# Patient Record
Sex: Female | Born: 1975
Health system: Southern US, Community
[De-identification: ages and names within clinical notes are randomized; demographics above are authoritative.]

## PROBLEM LIST (undated history)

## (undated) DIAGNOSIS — N809 Endometriosis, unspecified: Secondary | ICD-10-CM

## (undated) DIAGNOSIS — J45909 Unspecified asthma, uncomplicated: Secondary | ICD-10-CM

## (undated) HISTORY — PX: OTHER SURGICAL HISTORY: SHX169

## (undated) HISTORY — PX: SHOULDER SURGERY: SHX246

## (undated) HISTORY — DX: Unspecified asthma, uncomplicated: J45.909

## (undated) HISTORY — DX: Endometriosis, unspecified: N80.9

---

## 2003-01-09 ENCOUNTER — Ambulatory Visit (HOSPITAL_BASED_OUTPATIENT_CLINIC_OR_DEPARTMENT_OTHER): Admission: RE | Admit: 2003-01-09 | Discharge: 2003-01-09 | Payer: Self-pay | Admitting: Orthopedic Surgery

## 2007-07-03 ENCOUNTER — Emergency Department (HOSPITAL_COMMUNITY): Admission: EM | Admit: 2007-07-03 | Discharge: 2007-07-03 | Payer: Self-pay | Admitting: Emergency Medicine

## 2007-07-13 ENCOUNTER — Encounter: Admission: RE | Admit: 2007-07-13 | Discharge: 2007-07-13 | Payer: Self-pay | Admitting: Orthopedic Surgery

## 2008-01-25 ENCOUNTER — Encounter: Payer: Self-pay | Admitting: Pulmonary Disease

## 2008-02-06 ENCOUNTER — Ambulatory Visit: Payer: Self-pay | Admitting: Pulmonary Disease

## 2008-02-06 DIAGNOSIS — J45909 Unspecified asthma, uncomplicated: Secondary | ICD-10-CM | POA: Insufficient documentation

## 2008-02-06 DIAGNOSIS — J309 Allergic rhinitis, unspecified: Secondary | ICD-10-CM | POA: Insufficient documentation

## 2008-02-21 ENCOUNTER — Ambulatory Visit: Payer: Self-pay | Admitting: Pulmonary Disease

## 2008-02-21 DIAGNOSIS — J45990 Exercise induced bronchospasm: Secondary | ICD-10-CM | POA: Insufficient documentation

## 2008-06-09 ENCOUNTER — Ambulatory Visit: Payer: Self-pay | Admitting: Internal Medicine

## 2008-06-25 ENCOUNTER — Ambulatory Visit: Payer: Self-pay | Admitting: Pulmonary Disease

## 2008-06-27 ENCOUNTER — Encounter: Payer: Self-pay | Admitting: Pulmonary Disease

## 2008-06-27 LAB — CONVERTED CEMR LAB
ALT: 19 units/L (ref 0–35)
AST: 19 units/L (ref 0–37)
Albumin: 3.8 g/dL (ref 3.5–5.2)
Alkaline Phosphatase: 23 units/L — ABNORMAL LOW (ref 39–117)
BUN: 9 mg/dL (ref 6–23)
Basophils Absolute: 0 10*3/uL (ref 0.0–0.1)
Basophils Relative: 0.2 % (ref 0.0–3.0)
Bilirubin, Direct: 0.1 mg/dL (ref 0.0–0.3)
CO2: 29 meq/L (ref 19–32)
Calcium: 9.2 mg/dL (ref 8.4–10.5)
Chloride: 103 meq/L (ref 96–112)
Creatinine, Ser: 0.7 mg/dL (ref 0.4–1.2)
Eosinophils Absolute: 0.3 10*3/uL (ref 0.0–0.7)
Eosinophils Relative: 4.8 % (ref 0.0–5.0)
GFR calc Af Amer: 126 mL/min
GFR calc non Af Amer: 104 mL/min
Glucose, Bld: 104 mg/dL — ABNORMAL HIGH (ref 70–99)
HCT: 35.2 % — ABNORMAL LOW (ref 36.0–46.0)
Hemoglobin: 12.5 g/dL (ref 12.0–15.0)
Lymphocytes Relative: 33.2 % (ref 12.0–46.0)
MCHC: 35.4 g/dL (ref 30.0–36.0)
MCV: 91.9 fL (ref 78.0–100.0)
Monocytes Absolute: 0.5 10*3/uL (ref 0.1–1.0)
Monocytes Relative: 7.4 % (ref 3.0–12.0)
Neutro Abs: 3.8 10*3/uL (ref 1.4–7.7)
Neutrophils Relative %: 54.4 % (ref 43.0–77.0)
Platelets: 191 10*3/uL (ref 150–400)
Potassium: 3.9 meq/L (ref 3.5–5.1)
RBC: 3.84 M/uL — ABNORMAL LOW (ref 3.87–5.11)
RDW: 11.1 % — ABNORMAL LOW (ref 11.5–14.6)
Sodium: 138 meq/L (ref 135–145)
Total Bilirubin: 0.8 mg/dL (ref 0.3–1.2)
Total Protein: 7 g/dL (ref 6.0–8.3)
WBC: 6.9 10*3/uL (ref 4.5–10.5)

## 2008-07-01 LAB — CONVERTED CEMR LAB: IgE (Immunoglobulin E), Serum: 90.8 intl units/mL (ref 0.0–180.0)

## 2008-09-01 ENCOUNTER — Telehealth: Payer: Self-pay | Admitting: Pulmonary Disease

## 2009-02-10 ENCOUNTER — Telehealth (INDEPENDENT_AMBULATORY_CARE_PROVIDER_SITE_OTHER): Payer: Self-pay | Admitting: *Deleted

## 2009-02-20 ENCOUNTER — Encounter: Admission: RE | Admit: 2009-02-20 | Discharge: 2009-02-20 | Payer: Self-pay | Admitting: Family Medicine

## 2009-03-06 ENCOUNTER — Ambulatory Visit: Payer: Self-pay | Admitting: Pulmonary Disease

## 2009-03-10 ENCOUNTER — Telehealth (INDEPENDENT_AMBULATORY_CARE_PROVIDER_SITE_OTHER): Payer: Self-pay | Admitting: *Deleted

## 2009-03-10 ENCOUNTER — Ambulatory Visit: Payer: Self-pay | Admitting: Pulmonary Disease

## 2009-12-18 ENCOUNTER — Ambulatory Visit: Payer: Self-pay | Admitting: Pulmonary Disease

## 2010-01-29 ENCOUNTER — Ambulatory Visit: Payer: Self-pay | Admitting: Pulmonary Disease

## 2010-04-02 ENCOUNTER — Ambulatory Visit: Payer: Self-pay | Admitting: Pulmonary Disease

## 2010-04-07 ENCOUNTER — Telehealth (INDEPENDENT_AMBULATORY_CARE_PROVIDER_SITE_OTHER): Payer: Self-pay | Admitting: *Deleted

## 2010-04-09 ENCOUNTER — Encounter: Payer: Self-pay | Admitting: Pulmonary Disease

## 2010-04-23 ENCOUNTER — Ambulatory Visit: Payer: Self-pay | Admitting: Pulmonary Disease

## 2010-04-26 ENCOUNTER — Encounter: Payer: Self-pay | Admitting: Pulmonary Disease

## 2010-04-26 ENCOUNTER — Telehealth: Payer: Self-pay | Admitting: Pulmonary Disease

## 2010-04-29 ENCOUNTER — Telehealth (INDEPENDENT_AMBULATORY_CARE_PROVIDER_SITE_OTHER): Payer: Self-pay | Admitting: *Deleted

## 2010-05-04 ENCOUNTER — Telehealth (INDEPENDENT_AMBULATORY_CARE_PROVIDER_SITE_OTHER): Payer: Self-pay | Admitting: *Deleted

## 2010-05-07 ENCOUNTER — Telehealth (INDEPENDENT_AMBULATORY_CARE_PROVIDER_SITE_OTHER): Payer: Self-pay | Admitting: *Deleted

## 2010-08-18 ENCOUNTER — Telehealth: Payer: Self-pay | Admitting: Pulmonary Disease

## 2010-09-03 ENCOUNTER — Ambulatory Visit: Payer: Self-pay | Admitting: Pulmonary Disease

## 2010-12-07 NOTE — Assessment & Plan Note (Signed)
Summary: rov//mbw   Copy to:  Everette Rank, Christia Reading Primary Provider/Referring Provider:  Tally Joe  CC:  Asthma.  The patient c/o increased sob with exertion and talking  and a dry cough.  History of Present Illness: 35 year old Female with known history of  asthma.  She has been getting more trouble with her breathing.  She has chest tightness, but no wheeze.  She has sinus congestion and clears her throat a lot.  She tried cutting down how often she used her symbicort, and things got much worse.  She has been using her neti pott.  She gets sinus pressure.  She has not been having fever.  Current Medications (verified): 1)  Symbicort 160-4.5 Mcg/act  Aero (Budesonide-Formoterol Fumarate) .Marland Kitchen.. 1 Puffs Two Times A Day 2)  Singulair 10 Mg  Tabs (Montelukast Sodium) .... One By Mouth At Bedtime 3)  Xopenex Hfa 45 Mcg/act  Aero (Levalbuterol Tartrate) .... 2 Puffs Qid As Needed 4)  Levora 0.15/30 (28) 0.15-30 Mg-Mcg  Tabs (Levonorgestrel-Ethinyl Estrad) .... Use As Directed 5)  Calcium 6)  Iron 7)  Garlic 8)  Multivitamins 9)  Benadryl 25 Mg Caps (Diphenhydramine Hcl) .... One By Mouth As Needed 10)  Claritin 10 Mg Tabs (Loratadine) .... One By Mouth Once Daily As Needed 11)  Ra Suphedrine Pe Sinus 5-325 Mg Tabs (Phenylephrine-Acetaminophen) .... As Directed  Allergies (verified): 1)  ! Bayer Aspirin (Aspirin) 2)  ! Celebrex 3)  ! Percocet  Past History:  Past Medical History: Reviewed history from 03/06/2009 and no changes required. Asthma      - CT chest 01/25/08 normal      - 02/06/08 PFT FEV1 3.67 (118%), FVC 4.20 (108%), TLC 6.76 (127%), DLCO 110%, +BD response      - 06/27/08 RAST  positive to dust mites      - 06/27/08 IgE 90.8 Allergic Rhinitis      -Dr. Christia Reading Insomnia Pneumonia 2007  Past Surgical History: Reviewed history from 03/06/2009 and no changes required. left shoulder arthroscopy 01/09/03 by Dr. Mckinley Jewel  Vital Signs:  Patient  profile:   35 year old female Height:      65 inches (165.10 cm) Weight:      124 pounds (56.36 kg) BMI:     20.71 O2 Sat:      100 % on Room air Temp:     98.1 degrees F (36.72 degrees C) oral Pulse rate:   74 / minute BP sitting:   96 / 62  (left arm) Cuff size:   regular  Vitals Entered By: Michel Bickers CMA (December 18, 2009 4:25 PM)  O2 Sat at Rest %:  100 O2 Flow:  Room air  Physical Exam  General:  normal appearance and thin.   Nose:   no sinus tenderness, clear nasal discharge.   Mouth:  No oral lesions Neck:  no JVD.   Lungs:   no wheezing Heart:  regular rhythm, normal rate, and no murmurs.   Abdomen:  Thin, soft, nontender Extremities:  No edema cyanosis or clubbing Cervical Nodes:  no significant adenopathy   Impression & Recommendations:  Problem # 1:  ASTHMA (ICD-493.90) Will try her on higher dose inhaled steroids with advair 500/50.  Will continue other regimen.  If she is still having difficulty will consider starting xolair.  Problem # 2:  ALLERGIC RHINITIS (ICD-477.9) She has previous episodes of epistaxis with prior nasal steroids.  Will try her on veramyst.  Advised her to  use her neti pott prior to using veramyst.  Problem # 3:  EXERCISE INDUCED BRONCHOSPASM (ICD-493.81) As above.  Problem # 4:  CHEST PAIN, ATYPICAL (ICD-786.59) I am not sure what the cause of this is.  She may be having esophageal spasm or it could be musculoskeletal.  Advised her to follow up with primary care for further assessment of this.  Medications Added to Medication List This Visit: 1)  Ra Suphedrine Pe Sinus 5-325 Mg Tabs (Phenylephrine-acetaminophen) .... As directed  Complete Medication List: 1)  Symbicort 160-4.5 Mcg/act Aero (Budesonide-formoterol fumarate) .Marland Kitchen.. 1 puffs two times a day 2)  Singulair 10 Mg Tabs (Montelukast sodium) .... One by mouth at bedtime 3)  Xopenex Hfa 45 Mcg/act Aero (Levalbuterol tartrate) .... 2 puffs qid as needed 4)  Levora 0.15/30 (28)  0.15-30 Mg-mcg Tabs (Levonorgestrel-ethinyl estrad) .... Use as directed 5)  Calcium  6)  Iron  7)  Garlic  8)  Multivitamins  9)  Benadryl 25 Mg Caps (Diphenhydramine hcl) .... One by mouth as needed 10)  Claritin 10 Mg Tabs (Loratadine) .... One by mouth once daily as needed 11)  Ra Suphedrine Pe Sinus 5-325 Mg Tabs (Phenylephrine-acetaminophen) .... As directed  Other Orders: Est. Patient Level III (18841)  Patient Instructions: 1)  Try advair one puff two times a day 2)  do not use symbicort while using advair 3)  Try using veramyst two sprays once daily after using neti pott 4)  Continue singulair 10 mg at bedtime  5)  Follow up in 6 to 8 weeks

## 2010-12-07 NOTE — Assessment & Plan Note (Signed)
Summary: rov/apc   Copy to:    Primary Provider/Referring Provider:  Tally Joe  CC:  Pt c/o tightness and SOB with dry cough x 1 week.Marland Kitchen  History of Present Illness: 35 year old Female with known history of  asthma.  She has been having chest tightness and cough which have been getting progressively worse since March.  She feels that the change in weather has brought this on.  She has been bringing up clear to yellow sputum.  She denies hemoptysis.  She has not been getting much wheeze, and denies fever.  She has been getting nasal congestion, and some sneezing.  She has a clear nasal discharge.  She denies eye irritation, and has not noticed skin rashes.  She has been using xopenex sporadically, but is not sure if this helps much.  She has been able to tolerate symbicort two puffs two times a day without jitters.  She has been using claritin and benadryl as needed.  She continues to use singulair.  She has been intolerant of nasal sprays due to nose bleeds.  Current Medications (verified): 1)  Singulair 10 Mg  Tabs (Montelukast Sodium) .... One By Mouth At Bedtime 2)  Xopenex Hfa 45 Mcg/act  Aero (Levalbuterol Tartrate) .... 2 Puffs Qid As Needed 3)  Levora 0.15/30 (28) 0.15-30 Mg-Mcg  Tabs (Levonorgestrel-Ethinyl Estrad) .... Use As Directed 4)  Calcium 5)  Iron 6)  Garlic 7)  Multivitamins 8)  Symbicort 160-4.5 Mcg/act  Aero (Budesonide-Formoterol Fumarate) .Marland Kitchen.. 1 Puffs Two Times A Day  Allergies (verified): 1)  ! Bayer Aspirin (Aspirin) 2)  ! Celebrex 3)  ! Percocet  Past History:  Past Medical History:    Asthma         - CT chest 01/25/08 normal         - 02/06/08 PFT FEV1 3.67 (118%), FVC 4.20 (108%), TLC 6.76 (127%), DLCO 110%, +BD response         - 06/27/08 RAST  positive to dust mites         - 06/27/08 IgE 90.8    Allergic Rhinitis         -Dr. Christia Reading    Insomnia    Pneumonia 2007  Past Surgical History:    left shoulder arthroscopy 01/09/03 by Dr. Mckinley Jewel  Vital Signs:  Patient profile:   35 year old female Height:      65 inches Weight:      123.25 pounds BMI:     20.58 O2 Sat:      100 % Temp:     97.9 degrees F oral Pulse rate:   71 / minute BP sitting:   108 / 70  (left arm) Cuff size:   regular  Vitals Entered By: Carron Curie CMA (March 06, 2009 4:55 PM)  O2 Sat at Rest %:  100 O2 Flow:  room air CC: Pt c/o tightness and SOB with dry cough x 1 week. Comments Medications reviewed with patient Raven Furnas CMA  March 06, 2009 4:58 PM    Physical Exam  General:  normal appearance and thin.   Nose:   no sinus tenderness, clear nasal discharge.   Mouth:  No oral lesions Neck:  no JVD.   Lungs:  decreased breath sounds, poor air entry, no wheezing Heart:  regular rhythm, normal rate, and no murmurs.   Abdomen:  Thin, soft, nontender Extremities:  No edema cyanosis or clubbing Cervical Nodes:  no significant adenopathy   Impression &  Recommendations:  Problem # 1:  ASTHMA, WITH ACUTE EXACERBATION (ICD-493.92) I will give her a taper course of prednisone.  She is to continue symbicort and singulair.  She is to also continue as needed benadryl and claritin.  I have advised her to try using mucinex for her cough and chest congestion.  She can also use salt water gargles for her throat.  If her symptoms do not improve, she may need further evaluation for either allergy shots or xolair.  Problem # 2:  ALLERGIC RHINITIS (ICD-477.9) She is to continue on singulair, claritin, and benadryl.  Unfortunately she is not able to tolerate nasal sprays.  Medications Added to Medication List This Visit: 1)  Sterapred 5 Mg Tabs (Prednisone) .... Use as directed 2)  Benadryl 25 Mg Caps (Diphenhydramine hcl) .... One by mouth as needed 3)  Claritin 10 Mg Tabs (Loratadine) .... One by mouth once daily as needed  Complete Medication List: 1)  Symbicort 160-4.5 Mcg/act Aero (Budesonide-formoterol fumarate) .Marland Kitchen.. 1 puffs two  times a day 2)  Singulair 10 Mg Tabs (Montelukast sodium) .... One by mouth at bedtime 3)  Xopenex Hfa 45 Mcg/act Aero (Levalbuterol tartrate) .... 2 puffs qid as needed 4)  Levora 0.15/30 (28) 0.15-30 Mg-mcg Tabs (Levonorgestrel-ethinyl estrad) .... Use as directed 5)  Calcium  6)  Iron  7)  Garlic  8)  Multivitamins  9)  Sterapred 5 Mg Tabs (Prednisone) .... Use as directed 10)  Benadryl 25 Mg Caps (Diphenhydramine hcl) .... One by mouth as needed 11)  Claritin 10 Mg Tabs (Loratadine) .... One by mouth once daily as needed  Other Orders: Prescription Created Electronically [phone]) Est. Patient Level III (72536)  Patient Instructions: 1)  Sterapred use as directed 2)  Call if no better by next week 3)  Follow up in 4 to 6 months Prescriptions: STERAPRED 5 MG TABS (PREDNISONE) use as directed  #1 x 0   Entered and Authorized by:   Coralyn Helling MD   Signed by:   Coralyn Helling MD on 03/06/2009   Method used:   Electronically to        [facility]  [phone]* (retail)       [address] [place], [place]  [address]       Ph: [phone] or [phone]       Fax: [phone]   RxID:   [id number]

## 2010-12-07 NOTE — Progress Notes (Signed)
Summary: symbicort usage/ sob  Phone Note Call from Patient   Caller: Patient Call For: sood Summary of Call: pt states that she has had sob x 3 weeks. has begun (as of this weekend) using symbicort 2 puffs in the am and 1 in the pm. is this ok?  Initial call taken by: Tivis Ringer,  February 10, 2009 12:18 PM  Follow-up for Phone Call        Spoke with pt.  She states that she had noticed an increase in sob for several wks, so she started taking the symbicort 2 puffs in the am and 1 at night.  She states that this helps somewhat, but still feels need to use rescue inaher at least once per day.  Has ov with you on 4-30.  Please advise recs, thanks! Follow-up by: Vernie Murders,  February 10, 2009 2:05 PM  Additional Follow-up for Phone Call Additional follow up Details #1::        Have her use symbicort two puffs two times a day, continue singulair 10mg  at bedtime, and continue as needed xopenex.   Additional Follow-up by: Coralyn Helling MD,  February 10, 2009 7:20 PM    Additional Follow-up for Phone Call Additional follow up Details #2::    pt aware of dr sood's response, also told pt if she needed [place] before 4/30 to pls call back. Follow-up by: Philipp Deputy CMA,  February 11, 2009 10:02 AM

## 2010-12-07 NOTE — Progress Notes (Signed)
Summary: refill for singulair  Phone Note Call from Patient Call back at Home Phone [phone]   Caller: Patient Call For: Tena Linebaugh Reason for Call: Refill Medication Summary of Call: Pt needs refill on singulair 10mg , has ov scheduled for 10/28.[facility] Initial call taken by: Darletta Moll,  August 18, 2010 8:12 AM  Follow-up for Phone Call        spoke with pt singulair refill sent. pt aware. Pansey Pinheiro CMA  August 18, 2010 9:36 AM     Prescriptions: SINGULAIR 10 MG  TABS (MONTELUKAST SODIUM) one by mouth at bedtime  #30 Tablet x 0   Entered by:   Carron Curie CMA   Authorized by:   Coralyn Helling MD   Signed by:   Carron Curie CMA on 08/18/2010   Method used:   Electronically to        [facility]  [phone]* (retail)       [address] [place], [place]  [address]       Ph: [phone] or [phone]       Fax: [phone]   RxID:   [id number]

## 2010-12-07 NOTE — Progress Notes (Signed)
Summary: form needed/ re: DULERA-AWAITING FAX  Phone Note Call from Patient   Caller: Patient Call For: sood Summary of Call: pt was told by ins. co that dr sood's nurse needs to call re: getting form for non formulary drug DULERA- auth. call [phone]. ID # (if needed) is [id number]. pt ph# [phone] Initial call taken by: Tivis Ringer, CNA,  April 07, 2010 9:53 AM  Follow-up for Phone Call        Called the number that pt requested.  Was tranferred to several different prompts and then was told that I was calling the wrong number and transferred to another prompt and waited approx 10 minutes.  Will try again later. Vernie Murders  April 07, 2010 2:10 PM  Called the number listed again and was given the correct number which is [phone].  Awaiting form to be faxed to triage faxline. Follow-up by: Vernie Murders,  April 07, 2010 3:21 PM  Additional Follow-up for Phone Call Additional follow up Details #1::        Form placed in VS lookat to be completed. Vernie Murders  April 07, 2010 3:41 PM     Additional Follow-up for Phone Call Additional follow up Details #2::    I have completed form. Follow-up by: Coralyn Helling MD,  April 08, 2010 9:13 AM   Appended Document: form needed/ re: DULERA-AWAITING FAX form faxed and placed in triage to await approval or denial letter.   Appended Document: form needed/ re: DULERA-AWAITING FAX Approval letter for Elwin Sleight has been scanned into EMR.  Pt aware dulera is approved.  She verbalized understanding.

## 2010-12-07 NOTE — Assessment & Plan Note (Signed)
Summary: sob/cb   Visit Type:  Initial Consult Referred by:  Everette Rank, Christia Reading PCP:  Tally Joe  Chief Complaint:  Pulmonary Consult .  History of Present Illness: I met Ms. Rocha today for evaluaton of her difficulty breathing.    Her problems started [age] weeks age.  She says that she was driving at the time.  She then developed a pain in her chest on the left side.  This then moved to the center of her chest.  This pain lasted for hours.  Since then she has been having trouble with her breathing, and gets winded when walking.  Previously she was able to maintain a regular exercise program.  She has a dry cough.  She also has sinus problems with a runny nose.  She does have a history of allergy and asthma.  She says that she outgrew these problems when she was a child.  She used to get lots of sinus and ear infections as a child.  She has not been having much wheezing, and denies hemoptysis.  She has not had any fever, sweats, weight loss, or skin rashes.  She used to have problems with eczema.  She does c/o occasional reflux symptoms.  She does get problems with her breathing with exercise even before this current illness.  She has an albuterol inhaler which she feels only helps slightly.  She had started using asmanex, but stopped this after several days because she didn't notice any difference.  She has been started on prednisone and feels this has helped some.  She has been seen by Dr. Eldridge Dace.  She reports having a normal echo, and is scheduled for stress test next week.  She has been seen by Dr. Jenne Pane for her sinuses.    CT chest  01/25/08 was negative.  Spirometry  02/05/08 showed FEV1/FVC 99%, FEV1 2.61 (79%), FVC 2.63 (68%).  PFT's were done in my office today.  Spirometry showed post-bronchodilator FEV1/FVC 87%, FEV1 3.67 (118%), FVC 4.20 (108%), and bronchodilator response suggested by change in FEF 25-75.  Lung volumes showed TLC 6.76 (127%), and RV 2.52 (152%).   Diffusion capacity was 110%.  These would be consisted with no airflow obstruction, suggestion of bronchodilator responsiveness, and airtrapping.  Of note is that she has been on asmanex and prednisone recently.     Current Allergies: ! BAYER ASPIRIN (ASPIRIN) ! CELEBREX ! PERCOCET  Past Medical History:    Asthma    Allergic Rhinitis    Insomnia    Pneumonia 2007  Past Surgical History:    left shoulder surgery   Family History:    Mother - allergies, dissected carotid artery, HTN, Squamous Cell Carcinoma of skin    Father - CAD    Aunt, Uncle - Kidney failure    Aunt - Asthma  Social History:    Single, no history of smoking or significant alcohol use.  Works as Adult nurse.   Risk Factors:  Tobacco use:  never   Review of Systems  General      Complains of fatigue and sleep disorder.      Denies fever, chills, sweats, and weight loss.  Eyes      Denies blurring and double vision.  ENT      Complains of nasal congestion, nosebleeds, and post nasal drip.  CV      Complains of racing/skipping heart beats and shortness of breath with exertion.  Resp      Complains of cough and shortness  of breath.      Denies excessive sputum, coughing up blood, and wheezing.  GI      Complains of heartburn/acid reflux.   Vital Signs:  Patient Profile:   35 Years Old Female Height:     65 inches Weight:      124 pounds O2 Sat:      100 % O2 treatment:    Room Air Temp:     98.0 degrees F oral Pulse rate:   82 / minute BP sitting:   102 / 60  (left arm)  Vitals Entered By: Marinus Maw (February 06, 2008 2:51 PM)             Is Patient Diabetic? No Comments Medications reviewed with patient ..................................................................Marland KitchenMarinus Maw  February 06, 2008 2:54 PM      Physical Exam  General:     normal appearance and thin.   Eyes:     PERRLA and EOMI.   Nose:     clear nasal discharge, no sinus  tenderness Mouth:     No oral lesions Neck:     No LAN, no Thyromegaly Lungs:     No wheezing or rales Heart:     regular rhythm, normal rate, and no murmurs.   Abdomen:     Thin, soft, nontender, no organomegaly, + bowel sounds Extremities:     No edema cyanosis or clubbing Neurologic:     gait normal and strength normal, no focal deficits.     Impression & Recommendations:  Problem # 1:  DYSPNEA (ICD-786.05) Based on her prior history of asthma and allergies, and on the current PFT findings of bronchodilator response and airtrapping I suspect that some of her symptoms are related to asthma.  She has noticed a difference in her symptoms since being started on prednisone, and I would have her complete her tappering course of this.  I have also asked her to resume using her asmanex.  She is to continue on claritin as well.  I will start her on singulair 10 mg at bedtime.  She had a normal CT chest which should essentially exclude any lung parenchymal or pulmonary vascular problem.  She does not have any history or physical findings which would be suggestive of a connective tissue disease.  I will monitor her response to these interventions, and determine if additional measures are needed.  I have advised her that it can take some time before her symptoms get better. Orders: Full Pulmomary Function Test (PFT) Consultation Level V (16109)   Problem # 2:  CHEST PAIN (ICD-786.50) She is currently being evaluated by cardiology.  She had a negative Echo, and is scheduled to have a stress test.  If this is negative, then it is possible her chest pain could be related to reflux.  I have asked her to do a therapeutic trial of prilosec 20 mg once daily to see if this helps with her symptoms. Orders: Consultation Level V (60454)   Problem # 3:  ALLERGIC RHINITIS (ICD-477.9) She is to continue on claritin, and I will start her on singulair.  She apparently was not able to tolerated nasal  inhalers.  She is due to follow up with Dr. Jenne Pane for this.  She may also need to have allergy testing done if her symptoms persist. Her updated medication list for this problem includes:    Claritin 10 Mg Tabs (Loratadine) .Marland Kitchen... Take one tab each day.  Orders: Consultation Level V [phone])  Medications Added to Medication List This Visit: 1)  Levora 0.15/30 (28) 0.15-30 Mg-mcg Tabs (Levonorgestrel-ethinyl estrad) .... Use as directed 2)  Clonazepam 0.5 Mg Tabs (Clonazepam) .... Take one at bedtime 3)  Albuterol 90 Mcg/act Aers (Albuterol) .... Two puffs qid as needed 4)  Claritin 10 Mg Tabs (Loratadine) .... Take one tab each day. 5)  Calcium  6)  Iron  7)  Garlic  8)  Multivitamins  9)  Prednisone 20 Mg Tabs (Prednisone) .... Taper 10)  Asmanex 120 Metered Doses 220 Mcg/inh Aepb (Mometasone furoate) .... 2 puffs at bedtime 11)  Singulair 10 Mg Tabs (Montelukast sodium) .... One by mouth at bedtime 12)  Prilosec Otc 20 Mg Tbec (Omeprazole magnesium) .... One by mouth 30 minutes before breakfast   Patient Instructions: 1)  Asmanex 2 sprays at bedtime, and rinse your mouth after each use 2)  Singulair 10mg  at bedtime  3)  Albuterol 2 puffs upto four times per day as needed 4)  Prilosec 20 mg per day 30 minutes before breakfast.  You can get this over the counter 5)  Keep using Claritin 6)  Discuss with Dr. Jenne Pane about other steroid nose sprays 7)  Follow up in 2 to 3 weeks  Pt did not have Asmanex rx - given samples ..................................................................Marland KitchenMarinus Maw  February 06, 2008 5:01 PM   Prescriptions: SINGULAIR 10 MG  TABS (MONTELUKAST SODIUM) one by mouth at bedtime  #30 x 1   Entered and Authorized by:   Coralyn Helling MD   Signed by:   Coralyn Helling MD on 02/06/2008   Method used:   Print then Give to Patient   RxID:   [id number]  ]

## 2010-12-07 NOTE — Miscellaneous (Signed)
Summary: Orders Update pft charges  Clinical Lists Changes  Orders: Added new Service order of Carbon Monoxide diffusing w/capacity (94720) - Signed Added new Service order of Lung Volumes (94240) - Signed Added new Service order of Spirometry (Pre & Post) (94060) - Signed 

## 2010-12-07 NOTE — Medication Information (Signed)
Summary: Singulair/[facility]  Singulair/[facility]   Imported By: Sherian Rein 09/08/2010 08:47:05  _____________________________________________________________________  External Attachment:    Type:   Image     Comment:   External Document

## 2010-12-07 NOTE — Progress Notes (Signed)
Summary: symbicort side effect  Phone Note Call from Patient   Caller: Patient Call For: Delfin Squillace Summary of Call: pt c/o "shakiness/ nervousness". says this is due to the rx symbicort. [facility] [address] pt at work [phone] Initial call taken by: Tivis Ringer,  September 01, 2008 12:11 PM  Follow-up for Phone Call        Breathing is doing well.  Has problem with jitters after using symbicort.  Will decrease symbicort to one puff two times a day, and monitor.  If this persists will need to adjust inhaler regimen. Follow-up by: Coralyn Helling MD,  September 01, 2008 12:39 PM    New/Updated Medications: SYMBICORT 160-4.5 MCG/ACT  AERO (BUDESONIDE-FORMOTEROL FUMARATE) 1 puffs two times a day

## 2010-12-07 NOTE — Assessment & Plan Note (Signed)
Summary: rov ///kp   Primary Provider/Referring Provider:  Tally Joe  CC:  rov and no new problems.  History of Present Illness: 35 year old Female with known history of  asthma.  She has done better with dulera.  She was a 5 out of 10 with her breathing before dulera.  She is now a 8 out of 10.  She gets trouble with humid weather.  She still gets cough and chest tightness.  She does not have much sputum or wheeze.  She has not had fever, and her sinuses are okay.    Current Medications (verified): 1)  Dulera 200-5 Mcg/act Aero (Mometasone Furo-Formoterol Fum) .... Two Puffs Two Times A Day 2)  Singulair 10 Mg  Tabs (Montelukast Sodium) .... One By Mouth At Bedtime 3)  Xopenex Hfa 45 Mcg/act  Aero (Levalbuterol Tartrate) .... 2 Puffs Qid As Needed 4)  Levora 0.15/30 (28) 0.15-30 Mg-Mcg  Tabs (Levonorgestrel-Ethinyl Estrad) .... Use As Directed 5)  Calcium 6)  Iron 7)  Multivitamins 8)  Benadryl 25 Mg Caps (Diphenhydramine Hcl) .... One By Mouth As Needed 9)  Claritin 10 Mg Tabs (Loratadine) .... One By Mouth Once Daily As Needed 10)  B Complex  Tabs (B Complex Vitamins) .... Once Daily 11)  Sinus Wash Neti Pot 2300-700 Mg Kit (Sodium Chloride-Sodium Bicarb) .... Once Daily  Allergies (verified): 1)  ! Bayer Aspirin (Aspirin) 2)  ! Celebrex 3)  ! Percocet  Past History:  Past Medical History: Last updated: 03/06/2009 Asthma      - CT chest 01/25/08 normal      - 02/06/08 PFT FEV1 3.67 (118%), FVC 4.20 (108%), TLC 6.76 (127%), DLCO 110%, +BD response      - 06/27/08 RAST  positive to dust mites      - 06/27/08 IgE 90.8 Allergic Rhinitis      -Dr. Christia Reading Insomnia Pneumonia 2007  Past Surgical History: Last updated: 03/06/2009 left shoulder arthroscopy 01/09/03 by Dr. Mckinley Jewel  Vital Signs:  Patient profile:   35 year old female Height:      65 inches Weight:      123 pounds O2 Sat:      98 % on Room air Temp:     98.2 degrees F oral Pulse rate:    81 / minute BP sitting:   108 / 72  (right arm) Cuff size:   regular  Vitals Entered By: Denna Haggard, CMA (Apr 02, 2010 4:39 PM)  O2 Sat at Rest %:  98% O2 Flow:  Room air  Physical Exam  General:  normal appearance and thin.   Nose:   no sinus tenderness, clear nasal discharge.   Mouth:  No oral lesions Neck:  no JVD.   Lungs:   no wheezing Heart:  regular rhythm, normal rate, and no murmurs.   Extremities:  No edema cyanosis or clubbing Cervical Nodes:  no significant adenopathy   Impression & Recommendations:  Problem # 1:  ASTHMA (ICD-493.90) She has persistent asthma symptoms partially controlled with high dose ICS, LABA, and singulair.  She has a prior positive RAST and elevated IgE.  I think she would be a good candidate for xolair.  She is reluctant to do xolair at this time due to concern about how this may affect her work schedule.  I will repeat her pulmonary function test, and then discuss further whether we should push for xolair.  She has not been controlled on prior use of advair or symbicort.  She has improved control on dulera.  I have asked her to find out from her insurance company what the appeal process is for non-formulary medications.  Problem # 2:  ALLERGIC RHINITIS (ICD-477.9)  She is to continue [employer], singulair, and as needed anti-histamine.  She is not able to tolerate nasal sprays.  She may also need imaging studies of her sinuses.  Problem # 3:  EXERCISE INDUCED BRONCHOSPASM (ICD-493.81) Again advised to try using xopenex prior to exercising.  Complete Medication List: 1)  Dulera 200-5 Mcg/act Aero (Mometasone furo-formoterol fum) .... Two puffs two times a day 2)  Singulair 10 Mg Tabs (Montelukast sodium) .... One by mouth at bedtime 3)  Xopenex Hfa 45 Mcg/act Aero (Levalbuterol tartrate) .... 2 puffs qid as needed 4)  Levora 0.15/30 (28) 0.15-30 Mg-mcg Tabs (Levonorgestrel-ethinyl estrad) .... Use as directed 5)  Calcium  6)  Iron    7)  Multivitamins  8)  Benadryl 25 Mg Caps (Diphenhydramine hcl) .... One by mouth as needed 9)  Claritin 10 Mg Tabs (Loratadine) .... One by mouth once daily as needed 10)  B Complex Tabs (B complex vitamins) .... Once daily 11)  Sinus Wash Neti Pot 2300-700 Mg Kit (Sodium chloride-sodium bicarb) .... Once daily  Other Orders: Est. Patient Level III (21308) Full Pulmonary Function Test (PFT)  Patient Instructions: 1)  Will schedule breathing test (PFT) and call with the results 2)  Follow up in 4 months  Prevention & Chronic Care Immunizations   Influenza vaccine: Fluvax 3+  (09/07/2009)    Tetanus booster: Not documented    Pneumococcal vaccine: Not documented  Other Screening   Pap smear: Not documented   Smoking status: never  (02/06/2008)    Immunization History:  Influenza Immunization History:    Influenza:  fluvax 3+ (09/07/2009)

## 2010-12-07 NOTE — Assessment & Plan Note (Signed)
Summary: extreme sob/apc   Referred by:  Everette Rank, Christia Reading PCP:  Tally Joe  Chief Complaint:  asthma, diff breathing, chest tightness, and dry cough no wheezing x3-4 weeks.  History of Present Illness: 35 year old Female with known history of  asthma and atypical chest pain.  Recent stress test with Dr. Eldridge Dace, per the patient this was normal for her cardiac status,.  She had an episode last week in which she passed out.  She was standing outside while her mother was cutting her hair.  She suddenly felt light headed, and the fell to the floor.  She did not actually lose consciousness.  Her father brought her inside, and had her lay down.  He checked her blood pressure and found that it was low.  She has not had recurrence of this since then.  Seen by Dr. Jenne Pane, ENT-- cauderized one of her vessel for her epistaxis.  He gave her a sample of astepro, and recommended that she refrain from using nasal steroids for the time being.  06/09/08--Returns for persistent dry cough, tightness, decreased activity tolerance, no energy. Feels Asmanex is not working. Denies chest pain, orthopnea, hemoptysis, fever, n/v/d, edema.      Updated Prior Medication List: SINGULAIR 10 MG  TABS (MONTELUKAST SODIUM) one by mouth at bedtime ALBUTEROL 90 MCG/ACT  AERS (ALBUTEROL) Two puffs qid as needed LEVORA 0.15/30 (28) 0.15-30 MG-MCG  TABS (LEVONORGESTREL-ETHINYL ESTRAD) Use as directed CLONAZEPAM 0.5 MG  TABS (CLONAZEPAM) Take one at bedtime * CALCIUM  * IRON  * GARLIC  * MULTIVITAMINS  ASMANEX 120 METERED DOSES 220 MCG/INH  AEPB (MOMETASONE FUROATE) 2 puffs two times a day  Current Allergies (reviewed today): ! BAYER ASPIRIN (ASPIRIN) ! CELEBREX ! PERCOCET   Family History:    Reviewed history from 02/06/2008 and no changes required:       Mother - allergies, dissected carotid artery, HTN, Squamous Cell Carcinoma of skin       Father - CAD       Aunt, Uncle - Kidney failure       Aunt -  Asthma, heart disease       mother alive age 44  hx of stroke, borderline HBP       father alive age 3  hx of prostate problems       1 brother alive age 90  hx of joint pain       1 brother alive age 40  hx of hernia repair  Social History:    Reviewed history from 02/06/2008 and no changes required:       Single, no history of smoking or significant alcohol use.  Works as Adult nurse.  no children   Risk Factors: Tobacco use:  never   Review of Systems      See HPI   Vital Signs:  Patient Profile:   35 Years Old Female Height:     65 inches Weight:      119.25 pounds O2 Sat:      100 % O2 treatment:    Room Air Temp:     97.8 degrees F oral Pulse rate:   96 / minute BP sitting:   110 / 74  (left arm) Cuff size:   regular  Vitals Entered By: Boone Master CNA (June 09, 2008 4:44 PM)             Is Patient Diabetic? No Comments Medications reviewed with patient Boone Master CNA  June 09, 2008 4:47 PM      Physical Exam  GENERAL:  A/Ox3; pleasant & cooperative.NAD HEENT:  [place]/AT, EOM-wnl, PERRLA, EACs-clear, TMs-wnl, NOSE-clear, THROAT-clear & wnl. NECK:  Supple w/ fair ROM; no JVD; normal carotid impulses w/o bruits; no thyromegaly or nodules palpated; no lymphadenopathy. CHEST:  Clear to P & A; w/o, wheezes/ rales/ or rhonchi. HEART:  RRR, no m/r/g  heard ABDOMEN:  Soft & nt; nml bowel sounds; no organomegaly or masses detected. EXT: Warm bilat,  no calf pain, edema, clubbing, pulses intact Skin: no rash/lesion       Problem # 1:  ASTHMA (ICD-493.90) Multifactoral symptoms. Unclear if all related to asthma. Xopenex neb given   REC: Stop Asmanex Begin Symbicort 80/4.86mcg 2 puffs two times a day , rinse/gargle after each use follow up 2 weeks Dr. Craige Cotta.  Please contact office for sooner follow up if symptoms do not improve or worsen  The following medications were removed from the medication list:    Asmanex 120 Metered Doses 220 Mcg/inh Aepb  (Mometasone furoate) .Marland Kitchen... 2 puffs at bedtime  Her updated medication list for this problem includes:    Singulair 10 Mg Tabs (Montelukast sodium) ..... One by mouth at bedtime    Albuterol 90 Mcg/act Aers (Albuterol) .Marland Kitchen..Marland Kitchen Two puffs qid as needed    Asmanex 120 Metered Doses 220 Mcg/inh Aepb (Mometasone furoate) .Marland Kitchen... 2 puffs two times a day  Orders: Nebulizer Tx (16109) Est. Patient Level III (60454)   Medications Added to Medication List This Visit: 1)  Asmanex 120 Metered Doses 220 Mcg/inh Aepb (Mometasone furoate) .... 2 puffs two times a day  Complete Medication List: 1)  Singulair 10 Mg Tabs (Montelukast sodium) .... One by mouth at bedtime 2)  Albuterol 90 Mcg/act Aers (Albuterol) .... Two puffs qid as needed 3)  Levora 0.15/30 (28) 0.15-30 Mg-mcg Tabs (Levonorgestrel-ethinyl estrad) .... Use as directed 4)  Clonazepam 0.5 Mg Tabs (Clonazepam) .... Take one at bedtime 5)  Calcium  6)  Iron  7)  Garlic  8)  Multivitamins  9)  Asmanex 120 Metered Doses 220 Mcg/inh Aepb (Mometasone furoate) .... 2 puffs two times a day   Patient Instructions: 1)  Stop Asmanex 2)  Begin Symbicort 80/4.18mcg 2 puffs two times a day , rinse/gargle after each use 3)  follow up 2 weeks Dr. Craige Cotta.  4)  Please contact office for sooner follow up if symptoms do not improve or worsen    ]

## 2010-12-07 NOTE — Progress Notes (Signed)
Summary: talk to nurse  Phone Note Call from Patient Call back at Work Phone [phone]   Caller: Patient Call For: sood Summary of Call: Pt states she's still experiencing a lot of chest tightness, wants to know if he wants her to come in today for cxr or wait until she's done with her prednisone. Initial call taken by: Darletta Moll,  May 07, 2010 8:29 AM  Follow-up for Phone Call        Spoke with pt who started prednisone on the 28th 3rd day and feels no better chest still tight, feels no worse. Advised on 6/28 to call in few days. Since long weekend wants to know does she need cxr.  Dr Craige Cotta out of office today. Dr Sherene Sires doc of the day, Please advise .Kandice Hams CMA  May 07, 2010 9:27 AM needs ov today or go er if condition worsens  Follow-up by: Nyoka Cowden MD,  May 07, 2010 11:24 AM  Additional Follow-up for Phone Call Additional follow up Details #1::        Spoke withpt informed her of Dr Sherene Sires recommendations; offered pt appt this pm with NP, pt says she will monitor symptoms since just starting prednisone Kandice Hams [facility]  May 07, 2010 12:05 PM  Additional Follow-up by: Kandice Hams CMA,  May 07, 2010 12:05 PM

## 2010-12-07 NOTE — Assessment & Plan Note (Signed)
Summary: fu 2 wks////kp   Referred by:  Everette Rank, Christia Reading PCP:  Tally Joe  Chief Complaint:  cough somewhat better - fainted over the weekend - BP 87/74 per father.  History of Present Illness: I saw Ms. Messinger in follow up for her asthma and atypical chest pain.  Since her last visit she had a stress test with Dr. Eldridge Dace.  Per the patient this was normal for her cardiac status, although she did notice trouble with her breathing.  She was seen by Dr. Jenne Pane.  He cauderized one of her vessel for her epistaxis.  He gave her a sample of astepro, and recommended that she refrain from using nasal steroids for the time being.  She had an episode last week in which she passed out.  She was standing outside while her mother was cutting her hair.  She suddenly felt light headed, and the fell to the floor.  She did not actually lose consciousness.  Her father brought her inside, and had her lay down.  He checked her blood pressure and found that it was low.  She has not had recurrence of this since then.  With regard to her breathing she feels improved.  She rated her symptoms as 25 out of 100 at her first visit with me.  She now rates it as 80 out of 100.  She has been able to start exercising again.  She still has some difficulty with exercise, but this seems improved compared to before.  She try using prevacid, but only for a few days.  She continues to have chest discomfort.     Current Allergies: ! BAYER ASPIRIN (ASPIRIN) ! CELEBREX ! PERCOCET      Vital Signs:  Patient Profile:   35 Years Old Female Height:     65 inches Weight:      124.13 pounds O2 Sat:      97 % O2 treatment:    Room Air Temp:     98.0 degrees F oral Pulse rate:   78 / minute BP sitting:   106 / 60  (left arm)  Vitals Entered By: Marinus Maw (February 21, 2008 4:17 PM)             Comments Medications reviewed with  patient ..................................................................Marland KitchenMarinus Maw  February 21, 2008 4:23 PM      Physical Exam  Nose:     clear nasal discharge, no sinus tenderness Mouth:     No oral lesions Neck:     No LAN, no Thyromegaly Lungs:     No wheezing or rales Heart:     regular rhythm, normal rate, and no murmurs.   Abdomen:     Thin, soft, nontender Extremities:     No edema cyanosis or clubbing     Impression & Recommendations:  Problem # 1:  ASTHMA (ICD-493.90) She has improved quite a bit.  I will continue her on asmanex and singulair.  She is to use her albuterol as needed.  If her symptoms still persist, then we may need to add a LABA. The following medications were removed from the medication list:    Prednisone 20 Mg Tabs (Prednisone) .Marland Kitchen... Taper  Her updated medication list for this problem includes:    Asmanex 120 Metered Doses 220 Mcg/inh Aepb (Mometasone furoate) .Marland Kitchen... 2 puffs at bedtime    Singulair 10 Mg Tabs (Montelukast sodium) ..... One by mouth at bedtime    Albuterol 90 Mcg/act Aers (Albuterol) .Marland Kitchen..Marland Kitchen Two puffs  qid as needed  Orders: Est. Patient Level II (41324)   Problem # 2:  ALLERGIC RHINITIS (ICD-477.9) She is to continue on singulair, claritin and astepro.  No nasal steroids at this time due to nose bleeds. Her updated medication list for this problem includes:    Claritin 10 Mg Tabs (Loratadine) .Marland Kitchen... Take one tab each day.  Orders: Est. Patient Level II (40102)   Problem # 3:  CHEST PAIN (ICD-786.50) I again think this may be GI related.  I have advised her to stay on a PPI for at least 3 to 4 weeks to determine if this helps. Orders: Est. Patient Level II (72536)   Problem # 4:  EXERCISE INDUCED BRONCHOSPASM (ICD-493.81) I have discussed various techniques she can do prior to exercising to minimize her symptoms. The following medications were removed from the medication list:    Prednisone 20 Mg Tabs  (Prednisone) .Marland Kitchen... Taper  Her updated medication list for this problem includes:    Asmanex 120 Metered Doses 220 Mcg/inh Aepb (Mometasone furoate) .Marland Kitchen... 2 puffs at bedtime    Singulair 10 Mg Tabs (Montelukast sodium) ..... One by mouth at bedtime    Albuterol 90 Mcg/act Aers (Albuterol) .Marland Kitchen..Marland Kitchen Two puffs qid as needed  Orders: Est. Patient Level II (64403)   Problem # 5:  INSOMNIA UNSPECIFIED (ICD-780.52) Will address this at her next follow up Orders: Est. Patient Level II (47425)   Problem # 6:  VASOVAGAL SYNCOPE (ICD-780.2) I am not sure what the inciting event was for this.  She did have an essentially normal cardiac evaluation just recently.  Her blood pressure and heart rate are stable today.  I will defer further assessment of this for her primary physician, but I don't think this is related to her pulmonary status. Orders: Est. Patient Level II (95638)    Patient Instructions: 1)  Continue using asmanex and singulair. 2)  Use prilosec on a regular basis for the next 3 to 4 weeks 3)  Check your peak flow periodically 4)  Follow up in 3 months    Prescriptions: ASMANEX 120 METERED DOSES 220 MCG/INH  AEPB (MOMETASONE FUROATE) 2 puffs at bedtime  #1 x 3   Entered and Authorized by:   Coralyn Helling MD   Signed by:   Coralyn Helling MD on 02/21/2008   Method used:   Print then Give to Patient   RxID:   [id number]  ]

## 2010-12-07 NOTE — Progress Notes (Signed)
Summary: stopped Spiriva  Phone Note Call from Patient Call back at Work Phone [phone]   Caller: Patient Call For: sood Reason for Call: Talk to Nurse Summary of Call: pt had to stop taking Spiriva.  She said it made things worse.  Do you want to prescribe something else? [facility] Initial call taken by: Eugene Gavia,  May 04, 2010 1:30 PM  Follow-up for Phone Call        called and spoke with pt.  pt states she stopped Spiriva as directed per last phone note on 04-29-2010.  Pt states her breathing and coughing is almost back to baseline.  pt does still c/o tightness in chest.  pt wanted to know what VS recs are regarding a different inhaler to replace the spiriva.  please advise.  also, pt requests to have a sample first before getting an rx just in case she has difficulty with it like she did with the spiriva.  will forward message to VS to address. Aundra Millet Reynolds LPN  May 04, 2010 1:47 PM   Additional Follow-up for Phone Call Additional follow up Details #1::        She was not able to tolerate spiriva (developed cough and increased dyspnea after two doses).  She still has cough, chest tightness, and chest discomfort with cough.  Not much sputum and denies fever.  Will send script for prednisone taper.  Advised her to call if her symptoms do not improve.  If so, she will need further chest imaging studies. Additional Follow-up by: Coralyn Helling MD,  May 04, 2010 5:55 PM    New/Updated Medications: PREDNISONE 10 MG TABS (PREDNISONE) 3 pills for 2 days, 2 pills for 2 days, 1 pill for 2 days, then stop prednisone Prescriptions: PREDNISONE 10 MG TABS (PREDNISONE) 3 pills for 2 days, 2 pills for 2 days, 1 pill for 2 days, then stop prednisone  #12 x 0   Entered and Authorized by:   Coralyn Helling MD   Signed by:   Coralyn Helling MD on 05/04/2010   Method used:   Electronically to        [facility]  [phone]* (retail)       [address] [place], [place]   [address]       Ph: [phone] or [phone]       Fax: [phone]   RxID:   [id number]

## 2010-12-07 NOTE — Progress Notes (Signed)
Summary: sob  Phone Note Call from Patient Call back at Work Phone [phone]   Caller: Patient Call For: sood Reason for Call: Talk to Nurse Summary of Call: started Spiriva this week...having more trouble breathing than normally.  Is this normal?  Should she continue? Initial call taken by: Eugene Gavia,  April 29, 2010 11:21 AM  Follow-up for Phone Call        Per phone note from June 20,2011, pt to start spiriva and call office if sxs are no better with spiriva.   Spoke with pt.  Pt states since starting spiriva breathing is worse.  Having increased SOB and "a lot of tightness in chest and coughing."  Cough is dry.  Requesting recs- ? cont spiriva.  VS out of the office until tomorrow after lunch-pt did not want to wait until then.  Will forward message to doc of the day-Dr. Maple Hudson, pls advise.  Thanks! Gweneth Dimitri RN  April 29, 2010 1:42 PM Allergies:  ASA, Celebrex, perocoet    Additional Follow-up for Phone Call Additional follow up Details #1::        If cough seems worse BECAUSE of spiriva, then stop Spiriva. Otherwise- is there fever/ sore throat etc to suggest antibiotic. What does she think she needs? Additional Follow-up by: Waymon Budge MD,  April 29, 2010 1:52 PM    Additional Follow-up for Phone Call Additional follow up Details #2::    Spoke with pt.  Pt states cough does seem worse because of the spiriva.  Informed her due to this, stop Spiriva per CY-she verbalized understanding.  Pt also states throat is not sore-just seems irriated at this time-relates this to the coughing.  Pt also states vision was blurry yesterday and having bad HAs.  Denies fever.  Will forward message back to CY-pls advise if you would like to do anything else at this time.  If not, will forward message to VS as FYI.  Thanks! Gweneth Dimitri RN  April 29, 2010 2:03 PM   Additional Follow-up for Phone Call Additional follow up Details #3:: Details for Additional Follow-up Action Taken: OK to  send this message string to Dr Craige Cotta Additional Follow-up by: Waymon Budge MD,  April 29, 2010 8:11 PM

## 2010-12-07 NOTE — Medication Information (Signed)
Summary: Approved/[employer]  Approved/[employer]   Imported By: Lester [place] 04/16/2010 07:52:04  _____________________________________________________________________  External Attachment:    Type:   Image     Comment:   External Document

## 2010-12-07 NOTE — Assessment & Plan Note (Signed)
Summary: rov 14 days////kp   Visit Type:  Follow-up Referred by:  Everette Rank, Christia Reading PCP:  Tally Joe  Chief Complaint:  2 week follow up - feels much better on Symbicort.  History of Present Illness: 35 year old Female with known history of  asthma.  She was seen by Tammy earlier this month because her asthma symptoms were getting worse again.  She was started on symbicort 80/4.5.  This has helped, but she is still getting problems with chest tightness.  This especially happens when she is exercising or around air conditioning.  She tries using her proair, but this makes her feel jittery.     Current Allergies: ! BAYER ASPIRIN (ASPIRIN) ! CELEBREX ! PERCOCET      Vital Signs:  Patient Profile:   35 Years Old Female Height:     65 inches Weight:      119.50 pounds O2 Sat:      100 % O2 treatment:    Room Air Temp:     98.0 degrees F oral Pulse rate:   75 / minute BP sitting:   102 / 62  (left arm) Cuff size:   regular  Vitals Entered ByMarinus Maw (June 25, 2008 4:37 PM)             Comments Medications reviewed with patient Marinus Maw  June 25, 2008 4:39 PM      Physical Exam  General:     normal appearance and thin.   Nose:      no sinus tenderness Mouth:     No oral lesions Neck:     No LAN, no Thyromegaly Lungs:     Faint wheeze heard at left base Heart:     regular rhythm, normal rate, and no murmurs.   Abdomen:     Thin, soft, nontender Extremities:     No edema cyanosis or clubbing      Problem # 1:  ASTHMA (ICD-493.90) She has improved with the start of symbicort, but is still having some problems.  I will increase her dose of symbicort to 160/4.5 two puffs two times a day.  I will also switch her to xopenex HFA since she was not able to tolerate proair.  She is to continue on singulair.  I have advised her to try taking benadryl at night and claritin during the day.  I will also have her undergo lab test  to check for allergies.  Medications Added to Medication List This Visit: 1)  Xopenex Hfa 45 Mcg/act Aero (Levalbuterol tartrate) .... 2 puffs qid as needed 2)  Symbicort 160-4.5 Mcg/act Aero (Budesonide-formoterol fumarate) .... 2 puffs two times a day 3)  Symbicort 80-4.5 Mcg/act Aero (Budesonide-formoterol fumarate) .... Two puffs twice daily  Complete Medication List: 1)  Singulair 10 Mg Tabs (Montelukast sodium) .... One by mouth at bedtime 2)  Xopenex Hfa 45 Mcg/act Aero (Levalbuterol tartrate) .... 2 puffs qid as needed 3)  Levora 0.15/30 (28) 0.15-30 Mg-mcg Tabs (Levonorgestrel-ethinyl estrad) .... Use as directed 4)  Clonazepam 0.5 Mg Tabs (Clonazepam) .... Take one at bedtime 5)  Calcium  6)  Iron  7)  Garlic  8)  Multivitamins  9)  Symbicort 160-4.5 Mcg/act Aero (Budesonide-formoterol fumarate) .... 2 puffs two times a day   Patient Instructions: 1)  Symbicort 160/4.5 two puffs two times a day 2)  Xopenex 2 puffs upto 4 times per day as needed 3)  Singulair 10 mg at bedtime 4)  Try benadryl  25 mg at bedtime as needed 5)  Try claritin 10 mg once daily as needed  6)  Will call with blood test results 7)  Follow up in 2 months   Prescriptions: XOPENEX HFA 45 MCG/ACT  AERO (LEVALBUTEROL TARTRATE) 2 puffs qid as needed  #1 x 2   Entered and Authorized by:   Coralyn Helling MD   Signed by:   Coralyn Helling MD on 06/25/2008   Method used:   Electronically sent to ...       [facility]  [phone]*       [address]       [place], [place]  [address]       Ph: [phone] or [phone]       Fax: [phone]   RxID:   [id number] SYMBICORT 160-4.5 MCG/ACT  AERO (BUDESONIDE-FORMOTEROL FUMARATE) 2 puffs two times a day  #1 x 2   Entered and Authorized by:   Coralyn Helling MD   Signed by:   Coralyn Helling MD on 06/25/2008   Method used:   Electronically sent to ...       [facility]  [phone]*       [address]       [place], [place]  [address]        Ph: [phone] or [phone]       Fax: [phone]   RxID:   [id number]  ]

## 2010-12-07 NOTE — Assessment & Plan Note (Signed)
Summary: rov 6 wks ///kp   Copy to:  Everette Rank, Christia Reading Primary Provider/Referring Provider:  Tally Joe  CC:  Asthma.  Allergic rhinitis.  The patient c/o sinus drainage and cough with green mucus. She is doing the sinus rinses once daily. She used  the Advair samples for 2 weeks and says she did have a hard time adjusting back to the Symbicort.Marland Kitchen  History of Present Illness: 35 year old Female with known history of  asthma.  She felt better with advair compared to symbicort, but had hoarseness while on advair.  She has been having trouble with her sinuses.  She is not on anything for this.  She has been feeling feverish for the past 2 days, and has felt sweaty.  Her throat is dry, and she has been coughing more.  She also has some chest congestion.  She feels her chest is tight, but denies wheezing.  She has been using a netti pott, and this helps.  She tried using veramyst, but this gave her a nosebleed.  She has not been using xopenex.  Current Medications (verified): 1)  Symbicort 160-4.5 Mcg/act  Aero (Budesonide-Formoterol Fumarate) .Marland Kitchen.. 1 Puffs Two Times A Day 2)  Singulair 10 Mg  Tabs (Montelukast Sodium) .... One By Mouth At Bedtime 3)  Xopenex Hfa 45 Mcg/act  Aero (Levalbuterol Tartrate) .... 2 Puffs Qid As Needed 4)  Levora 0.15/30 (28) 0.15-30 Mg-Mcg  Tabs (Levonorgestrel-Ethinyl Estrad) .... Use As Directed 5)  Calcium 6)  Iron 7)  Multivitamins 8)  Benadryl 25 Mg Caps (Diphenhydramine Hcl) .... One By Mouth As Needed 9)  Claritin 10 Mg Tabs (Loratadine) .... One By Mouth Once Daily As Needed 10)  Sudafed 12 Hour 120 Mg Xr12h-Tab (Pseudoephedrine Hcl) .... As Directed 11)  B Complex  Tabs (B Complex Vitamins) .... Once Daily 12)  Veramyst 27.5 Mcg/spray Susp (Fluticasone Furoate) .... 2 Sprays in Each Nostril Daily 13)  Sinus Wash Neti Pot 2300-700 Mg Kit (Sodium Chloride-Sodium Bicarb) .... Once Daily  Allergies (verified): 1)  ! Bayer Aspirin (Aspirin) 2)  !  Celebrex 3)  ! Percocet  Past History:  Past Medical History: Reviewed history from 03/06/2009 and no changes required. Asthma      - CT chest 01/25/08 normal      - 02/06/08 PFT FEV1 3.67 (118%), FVC 4.20 (108%), TLC 6.76 (127%), DLCO 110%, +BD response      - 06/27/08 RAST  positive to dust mites      - 06/27/08 IgE 90.8 Allergic Rhinitis      -Dr. Christia Reading Insomnia Pneumonia 2007  Past Surgical History: Reviewed history from 03/06/2009 and no changes required. left shoulder arthroscopy 01/09/03 by Dr. Mckinley Jewel  Vital Signs:  Patient profile:   35 year old female Height:      65 inches (165.10 cm) Weight:      122.50 pounds (55.68 kg) BMI:     20.46 O2 Sat:      100 % on Room air Temp:     97.7 degrees F (36.50 degrees C) oral Pulse rate:   85 / minute BP sitting:   108 / 72  (left arm) Cuff size:   regular  Vitals Entered By: Michel Bickers CMA (January 29, 2010 4:49 PM)  O2 Sat at Rest %:  100 O2 Flow:  Room air CC: Asthma.  Allergic rhinitis.  The patient c/o sinus drainage and cough with green mucus. She is doing the sinus rinses once daily. She  used  the Advair samples for 2 weeks and says she did have a hard time adjusting back to the Symbicort.   Physical Exam  General:  normal appearance and thin.   Nose:   no sinus tenderness, clear nasal discharge.   Mouth:  No oral lesions Neck:  no JVD.   Lungs:   no wheezing Heart:  regular rhythm, normal rate, and no murmurs.   Extremities:  No edema cyanosis or clubbing Cervical Nodes:  no significant adenopathy   Impression & Recommendations:  Problem # 1:  ASTHMA (ICD-493.90) I will change her to high dose dulera.  If she continues to have difficulties, may need to consider adding Xolair.  Problem # 2:  ALLERGIC RHINITIS (ICD-477.9) She is to continue EchoStar, singulair, and as needed anti-histamine.  She is not able to tolerate nasal sprays.  She may also need imaging studies of her  sinuses.  Problem # 3:  URI (ICD-465.9) She likely has a viral URI.  I don't think she needs antibiotics at this time.  Advised her to call if her symptoms get worse.  Medications Added to Medication List This Visit: 1)  Dulera 200-5 Mcg/act Aero (Mometasone furo-formoterol fum) .... Two puffs two times a day 2)  Sudafed 12 Hour 120 Mg Xr12h-tab (Pseudoephedrine hcl) .... As directed 3)  Veramyst 27.5 Mcg/spray Susp (Fluticasone furoate) .... 2 sprays in each nostril daily 4)  B Complex Tabs (B complex vitamins) .... Once daily 5)  Sinus Wash Neti Pot 2300-700 Mg Kit (Sodium chloride-sodium bicarb) .... Once daily  Complete Medication List: 1)  Dulera 200-5 Mcg/act Aero (Mometasone furo-formoterol fum) .... Two puffs two times a day 2)  Singulair 10 Mg Tabs (Montelukast sodium) .... One by mouth at bedtime 3)  Xopenex Hfa 45 Mcg/act Aero (Levalbuterol tartrate) .... 2 puffs qid as needed 4)  Levora 0.15/30 (28) 0.15-30 Mg-mcg Tabs (Levonorgestrel-ethinyl estrad) .... Use as directed 5)  Calcium  6)  Iron  7)  Multivitamins  8)  Benadryl 25 Mg Caps (Diphenhydramine hcl) .... One by mouth as needed 9)  Claritin 10 Mg Tabs (Loratadine) .... One by mouth once daily as needed 10)  Sudafed 12 Hour 120 Mg Xr12h-tab (Pseudoephedrine hcl) .... As directed 11)  B Complex Tabs (B complex vitamins) .... Once daily 12)  Sinus Wash Neti Pot 2300-700 Mg Kit (Sodium chloride-sodium bicarb) .... Once daily  Other Orders: Est. Patient Level III (02542)  Patient Instructions: 1)  Stop symbicort 2)  Dulera two puffs two times a day, and rinse mouth after each use 3)  Follow up in 4 to 6 weeks Prescriptions: DULERA 200-5 MCG/ACT AERO (MOMETASONE FURO-FORMOTEROL FUM) two puffs two times a day  #1 x 3   Entered and Authorized by:   Coralyn Helling MD   Signed by:   Coralyn Helling MD on 01/29/2010   Method used:   Electronically to        [facility]  [phone]* (retail)       [address] [place], [place]  [address]       Ph: [phone] or [phone]       Fax: [phone]   RxID:   [id number]

## 2010-12-07 NOTE — Assessment & Plan Note (Signed)
Summary: rov/jd   Visit Type:  Follow-up Primary Provider/Referring Provider:  Tally Joe  CC:  Asthma follow-up...patient c/o occasional cough with yellow mucus...no longer using Dulera...c/o sinus trouble.  History of Present Illness: 35 year old Female with known history of  asthma.  She did feel better after her last course of prednisone.  She was told by a friend about breathe easy tea.  She is drinking 3 cups per day.  Since she started doing this her breathing has improved.  She has since stopped using dulera, and has not noticed any worsening of her symptoms.  She continues to use singulair and claritin.  She also continues to use nasal irrigation.    Current Medications (verified): 1)  Singulair 10 Mg  Tabs (Montelukast Sodium) .... One By Mouth At Bedtime 2)  Xopenex Hfa 45 Mcg/act  Aero (Levalbuterol Tartrate) .... 2 Puffs Qid As Needed 3)  Levora 0.15/30 (28) 0.15-30 Mg-Mcg  Tabs (Levonorgestrel-Ethinyl Estrad) .... Use As Directed 4)  Calcium 5)  Iron 6)  Multivitamins 7)  Benadryl 25 Mg Caps (Diphenhydramine Hcl) .... One By Mouth As Needed 8)  Claritin 10 Mg Tabs (Loratadine) .... One By Mouth Once Daily As Needed 9)  B Complex  Tabs (B Complex Vitamins) .... Once Daily 10)  Sinus Wash Neti Pot 2300-700 Mg Kit (Sodium Chloride-Sodium Bicarb) .... Once Daily 11)  Breathe Easy Tea .Marland Kitchen.. 3 Cups Daily  Allergies (verified): 1)  ! Bayer Aspirin (Aspirin) 2)  ! Celebrex 3)  ! Percocet  Past History:  Past Medical History: Reviewed history from 03/06/2009 and no changes required. Asthma      - CT chest 01/25/08 normal      - 02/06/08 PFT FEV1 3.67 (118%), FVC 4.20 (108%), TLC 6.76 (127%), DLCO 110%, +BD response      - 06/27/08 RAST  positive to dust mites      - 06/27/08 IgE 90.8 Allergic Rhinitis      -Dr. Christia Reading Insomnia Pneumonia 2007  Past Surgical History: Reviewed history from 03/06/2009 and no changes required. left shoulder arthroscopy 01/09/03  by Dr. Mckinley Jewel  Review of Systems       The patient complains of nasal congestion/difficulty breathing through nose.  The patient denies shortness of breath with activity, shortness of breath at rest, productive cough, non-productive cough, coughing up blood, chest pain, indigestion, abdominal pain, difficulty swallowing, sore throat, ear ache, hand/feet swelling, rash, change in color of mucus, and fever.    Vital Signs:  Patient profile:   35 year old female Height:      65 inches (165.10 cm) Weight:      123.50 pounds (56.14 kg) BMI:     20.63 O2 Sat:      95 % on Room air Temp:     98.5 degrees F (36.94 degrees C) oral Pulse rate:   110 / minute BP sitting:   110 / 78  (left arm) Cuff size:   regular  Vitals Entered By: Michel Bickers CMA (September 03, 2010 4:29 PM)  O2 Sat at Rest %:  95 O2 Flow:  Room air CC: Asthma follow-up...patient c/o occasional cough with yellow mucus...no longer using Dulera...c/o sinus trouble Comments Medications reviewed with patient Michel Bickers [facility]  September 03, 2010 4:38 PM   Physical Exam  General:  normal appearance and thin.   Nose:   no sinus tenderness, clear nasal discharge.   Mouth:  No oral lesions Neck:  no JVD.   Lungs:  clear bilaterally to auscultation and percussion Heart:  regular rhythm, normal rate, and no murmurs.   Extremities:  No edema cyanosis or clubbing Neurologic:  normal CN II-XII and strength normal.   Cervical Nodes:  no significant adenopathy Psych:  alert and cooperative; normal mood and affect; normal attention span and concentration   Impression & Recommendations:  Problem # 1:  ASTHMA (ICD-493.90) She has improved with Breathe Easy Tea.  Will continue with singulair, claritin, and as needed xopenex.  Will monitor off of inhaled corticosteroids.  Problem # 2:  ALLERGIC RHINITIS (ICD-477.9) She is to continue singulair, claritin, and nasal irrigation.  She is not able to tolerate nasal steroid  sprays.  Problem # 3:  EXERCISE INDUCED BRONCHOSPASM (ICD-493.81) As above.  Medications Added to Medication List This Visit: 1)  Breathe Easy Tea  .Marland Kitchen.. 3 cups daily  Complete Medication List: 1)  Singulair 10 Mg Tabs (Montelukast sodium) .... One by mouth at bedtime 2)  Claritin 10 Mg Tabs (Loratadine) .... One by mouth once daily as needed 3)  Xopenex Hfa 45 Mcg/act Aero (Levalbuterol tartrate) .... 2 puffs qid as needed 4)  Breathe Easy Tea  .Marland Kitchen.. 3 cups daily 5)  Sinus Wash Neti Pot 2300-700 Mg Kit (Sodium chloride-sodium bicarb) .... Once daily 6)  Benadryl 25 Mg Caps (Diphenhydramine hcl) .... One by mouth as needed 7)  Levora 0.15/30 (28) 0.15-30 Mg-mcg Tabs (Levonorgestrel-ethinyl estrad) .... Use as directed 8)  B Complex Tabs (B complex vitamins) .... Once daily 9)  Calcium  10)  Iron  11)  Multivitamins   Other Orders: Est. Patient Level III ([address]) Admin 1st Vaccine (60454) Flu Vaccine 8yrs + (09811)  Patient Instructions: 1)  Flu shot today 2)  Follow up in 6 months Prescriptions: SINGULAIR 10 MG  TABS (MONTELUKAST SODIUM) one by mouth at bedtime  #90 x 3   Entered and Authorized by:   Coralyn Helling MD   Signed by:   Coralyn Helling MD on 09/03/2010   Method used:   Print then Mail to Patient   RxID:   [id number]                  Flu Vaccine Consent Questions     Do you have a history of severe allergic reactions to this vaccine? no    Any prior history of allergic reactions to egg and/or gelatin? no    Do you have a sensitivity to the preservative Thimersol? no    Do you have a past history of Guillan-Barre Syndrome? no    Do you currently have an acute febrile illness? no    Have you ever had a severe reaction to latex? no    Vaccine information given and explained to patient? yes    Are you currently pregnant? no    Lot Number:[id number]   Exp Date:05/07/2011   Site Given  Right Deltoid IMu1 Zackery Barefoot CMA  September 03, 2010 5:20 PM

## 2010-12-07 NOTE — Progress Notes (Signed)
Summary: still sick  Phone Note Call from Patient Call back at Work Phone [phone]   Caller: Patient Call For: SOOD Summary of Call: tightness in chest, congestion, sob, cough(really bad, brings pt to tears).  Was in 04/30, doesn't feel any better.  Please advise Initial call taken by: Eugene Gavia,  Mar 10, 2009 10:38 AM  Follow-up for Phone Call        Pt seen 4/30- still having chest tightness, cough and sob.  States that her cough is mainly dry but sometimes produces yellow sputum.  Feels that she is no better at all since seen, but states that she is no worse.  Still taking pred pack.  Please advise recs thanks! Follow-up by: Vernie Murders,  Mar 10, 2009 11:06 AM  Additional Follow-up for Phone Call Additional follow up Details #1::        She has continued cough with clear to yellow sputum, and chest congestion.  She feels like most of her congestion is on the left side.  I have asked her to come to the office for a chest xray today.  I have asked her to come to the office at 1:30 pm today for the xray.  I will also give her a course of zithromax. Additional Follow-up by: Coralyn Helling MD,  Mar 10, 2009 12:31 PM    New/Updated Medications: ZITHROMAX Z-PAK 250 MG TABS (AZITHROMYCIN) use as directed   Prescriptions: ZITHROMAX Z-PAK 250 MG TABS (AZITHROMYCIN) use as directed  #1 x 0   Entered and Authorized by:   Coralyn Helling MD   Signed by:   Coralyn Helling MD on 03/10/2009   Method used:   Electronically to        [facility]  [phone]* (retail)       [address] [place], [place]  [address]       Ph: [phone] or [phone]       Fax: [phone]   RxID:   [id number]

## 2010-12-07 NOTE — Miscellaneous (Signed)
Summary: Pulmonary function test   Pulmonary Function Test Date: 04/23/2010 Height (in.): 65 Gender: Female  Pre-Spirometry FVC    Value: 4.26 L/min   Pred: 3.86 L/min     % Pred: 111 % FEV1    Value: 3.56 L     Pred: 3.05 L     % Pred: 117 % FEV1/FVC  Value: 83 %     Pred: 78 %    FEF 25-75  Value: 3.99 L/min   Pred: 3.49 L/min     % Pred: 114 %  Post-Spirometry FVC    Value: 4.34 L/min   Pred: 3.86 L/min     % Pred: 113 % FEV1    Value: 3.65 L     Pred: 3.05 L     % Pred: 119 % FEV1/FVC  Value: 84 %     Pred: 78 %     % Pred: . % FEF 25-75  Value: 4.16 L/min   Pred: 3.49 L/min     % Pred: 119 %  Lung Volumes TLC    Value: 6.48 L   % Pred: 122 % RV    Value: 2.21 L   % Pred: 132 % DLCO    Value: 20.8 %   % Pred: 96 % DLCO/VA  Value: 4.20 %   % Pred: 95 %  Comments: Normal spirometry.  No bronchodilator response.  Increase TLC and RV suggestive of airtrapping.  Normal diffusion. Clinical Lists Changes  Observations: Added new observation of PFT COMMENTS: Normal spirometry.  No bronchodilator response.  Increase TLC and RV suggestive of airtrapping.  Normal diffusion. (04/23/2010 14:02) Added new observation of DLCO/VA%EXP: 95 % (04/23/2010 14:02) Added new observation of DLCO/VA: 4.20 % (04/23/2010 14:02) Added new observation of DLCO % EXPEC: 96 % (04/23/2010 14:02) Added new observation of DLCO: 20.8 % (04/23/2010 14:02) Added new observation of RV % EXPECT: 132 % (04/23/2010 14:02) Added new observation of RV: 2.21 L (04/23/2010 14:02) Added new observation of TLC % EXPECT: 122 % (04/23/2010 14:02) Added new observation of TLC: 6.48 L (04/23/2010 14:02) Added new observation of FEF2575%EXPS: 119 % (04/23/2010 14:02) Added new observation of PSTFEF25/75P: 3.49  (04/23/2010 14:02) Added new observation of PSTFEF25/75%: 4.16 L/min (04/23/2010 14:02) Added new observation of PSTFEV1/FCV%: . % (04/23/2010 14:02) Added new observation of FEV1FVCPRDPS: 78 % (04/23/2010  14:02) Added new observation of PSTFEV1/FVC: 84 % (04/23/2010 14:02) Added new observation of POSTFEV1%PRD: 119 % (04/23/2010 14:02) Added new observation of FEV1PRDPST: 3.05 L (04/23/2010 14:02) Added new observation of POST FEV1: 3.65 L/min (04/23/2010 14:02) Added new observation of POST FVC%EXP: 113 % (04/23/2010 14:02) Added new observation of FVCPRDPST: 3.86 L/min (04/23/2010 14:02) Added new observation of POST FVC: 4.34 L (04/23/2010 14:02) Added new observation of FEF % EXPEC: 114 % (04/23/2010 14:02) Added new observation of FEF25-75%PRE: 3.49 L/min (04/23/2010 14:02) Added new observation of FEF 25-75%: 3.99 L/min (04/23/2010 14:02) Added new observation of FEV1/FVC PRE: 78 % (04/23/2010 14:02) Added new observation of FEV1/FVC: 83 % (04/23/2010 14:02) Added new observation of FEV1 % EXP: 117 % (04/23/2010 14:02) Added new observation of FEV1 PREDICT: 3.05 L (04/23/2010 14:02) Added new observation of FEV1: 3.56 L (04/23/2010 14:02) Added new observation of FVC % EXPECT: 111 % (04/23/2010 14:02) Added new observation of FVC PREDICT: 3.86 L (04/23/2010 14:02) Added new observation of FVC: 4.26 L (04/23/2010 14:02) Added new observation of PFT HEIGHT: 65  (04/23/2010 14:02) Added new observation of PFT DATE: 04/23/2010  (04/23/2010 14:02)

## 2010-12-07 NOTE — Progress Notes (Signed)
  Phone Note Outgoing Call   Call placed by: Coralyn Helling MD,  April 26, 2010 2:12 PM Call placed to: Patient Summary of Call: PFT results d/w pt.  Still has airtrapping on PFT.  She still has chest discomfort and dyspnea.  Will start spiriva one puff once daily, and continue current dose of dulera, singulair, and as needed xopenex.  She is to call in next few weeks if symptoms are no better with spiriva.  Otherwise plan on ROV in 4 months as previously arranged.    New/Updated Medications: SPIRIVA HANDIHALER 18 MCG CAPS (TIOTROPIUM BROMIDE MONOHYDRATE) one puff once daily Prescriptions: SPIRIVA HANDIHALER 18 MCG CAPS (TIOTROPIUM BROMIDE MONOHYDRATE) one puff once daily  #30 x 6   Entered and Authorized by:   Coralyn Helling MD   Signed by:   Coralyn Helling MD on 04/26/2010   Method used:   Electronically to        [facility]  [phone]* (retail)       [address] [place], [place]  [address]       Ph: [phone] or [phone]       Fax: [phone]   RxID:   [id number]

## 2010-12-21 ENCOUNTER — Ambulatory Visit: Payer: Self-pay | Admitting: Obstetrics and Gynecology

## 2010-12-29 ENCOUNTER — Encounter (INDEPENDENT_AMBULATORY_CARE_PROVIDER_SITE_OTHER): Payer: Self-pay | Admitting: *Deleted

## 2011-01-04 NOTE — Letter (Signed)
Summary: New Patient letter  [facility]  [address] [place], [place] [address]   Phone: [phone]  Fax: [phone]       12/29/2010 MRN: [id number]  [facility] [address] [place], [place]  [address]  Dear Ms. Hansell,  Welcome to the Gastroenterology Division at [facility].    You are scheduled to see Dr.  Christella Hartigan on 01-21-11 at 8:30A.M. on the 3rd floor at [facility], [address]  We ask that you try to arrive at our office 15 minutes prior to your appointment time to allow for check-in.  We would like you to complete the enclosed self-administered evaluation form prior to your visit and bring it with you on the day of your appointment.  We will review it with you.  Also, please bring a complete list of all your medications or, if you prefer, bring the medication bottles and we will list them.  Please bring your insurance card so that we may make a copy of it.  If your insurance requires a referral to see a specialist, please bring your referral form from your primary care physician.  Co-payments are due at the time of your visit and may be paid by cash, check or credit card.     Your office visit will consist of a consult with your physician (includes a physical exam), any laboratory testing he/she may order, scheduling of any necessary diagnostic testing (e.g. x-ray, ultrasound, CT-scan), and scheduling of a procedure (e.g. Endoscopy, Colonoscopy) if required.  Please allow enough time on your schedule to allow for any/all of these possibilities.    If you cannot keep your appointment, please call [phone] to cancel or reschedule prior to your appointment date.  This allows [place] the opportunity to schedule an appointment for another patient in need of care.  If you do not cancel or reschedule by 5 p.m. the business day prior to your appointment date, you will be charged a $50.00 late cancellation/no-show fee.    Thank you for  choosing [facility] for your medical needs.  We appreciate the opportunity to care for you.  Please visit [place] at our website  to learn more about our practice.                     Sincerely,                                                             The Gastroenterology Division

## 2011-01-21 ENCOUNTER — Ambulatory Visit: Payer: Self-pay | Admitting: Gastroenterology

## 2011-02-01 ENCOUNTER — Ambulatory Visit: Payer: Self-pay | Admitting: Obstetrics and Gynecology

## 2011-02-04 ENCOUNTER — Ambulatory Visit: Payer: Self-pay | Admitting: Obstetrics and Gynecology

## 2011-02-07 LAB — PATHOLOGY REPORT

## 2011-03-25 NOTE — Op Note (Signed)
NAME:  Traci Wyatt, Traci Wyatt                  ACCOUNT NO.:  [id number]   MEDICAL RECORD NO.:  [id number]                   PATIENT TYPE:  AMB   LOCATION:  DSC                                  FACILITY:  [facility]   PHYSICIAN:  Loreta Ave, M.D.              DATE OF BIRTH:  05-02-1976   DATE OF PROCEDURE:  01/09/2003  DATE OF DISCHARGE:                                 OPERATIVE REPORT   PREOPERATIVE DIAGNOSIS:  Chronic impingement, left shoulder.   POSTOPERATIVE DIAGNOSIS:  Chronic impingement, left shoulder.  Grade III  chondromalacia AC joint.  Anterior labrum tear.   PROCEDURE:  Left shoulder examination under anesthesia, arthroscopy,  debridement of labrum and undersurface as well as superior rotator cuff.  Acromioplasty.  CA ligament release. Excision of distal clavicle.   SURGEON:  Loreta Ave, M.D.   ASSISTANT:  Arlys John D. Petrarca, P.A.-C.   ANESTHESIA:  General.   ESTIMATED BLOOD LOSS:  Minimal.   SPECIMENS:  None.  Cultures; none.   COMPLICATIONS:  None.   DRESSING:  Soft compressive with sling.   DESCRIPTION OF PROCEDURE:  The patient was brought to the operating room and  placed on the operating table in the supine position.  After adequate  anesthesia had been obtained, the shoulder was examined.  Full motion and  good stability on both sides.  Placed in a beach chair position on the  shoulder positioner.  Prepped and draped in the usual sterile fashion.  Three portals were created, standard shoulder portals, anterior, posterior,  and lateral.  Shoulder entered with a blunt obturator, distended, and  inspected.  Little fraying on the anterior labrum debrided.  Capsular  ligamentous structures, articular cartilage, biceps tendon, biceps anchor  intact.  A little fraying and partial tearing crescent region of  supraspinatus and infraspinatus tendon in the intra-articular portion of the  cuff debrided.  Did not look like a picture of the internal  impingement.  Attention was redirected subacromially.  Markedly thickened bursa and  roughening on top of the cuff.  Slight down sloping of the acromion,  although, this was not markedly hooked.  The bursa resected, cuff debrided.  The front of the acromion trimmed off and tapered superiorly with an  acromioplasty and CA ligament release.  Hemostasis with cautery.  Distal  clavicle had some grade II chondral changes contributing to impingement as  well.  Lateral cm resected.  Accuracy of decompression and  clavicle excision, cuff debridement all confirmed again from all portals.  Instruments and fluid removed.  Portals of the shoulder and bursa injected  with Marcaine.  The portal was closed with 4-0 nylon. Sterile compressive  dressing applied.  Anesthesia reversed.  Brought to the recovery room.  Tolerated the surgery well with no complications.  Loreta Ave, M.D.    DFM/MEDQ  D:  01/09/2003  T:  01/09/2003  Job:  [id number]

## 2012-05-21 ENCOUNTER — Ambulatory Visit
Admission: RE | Admit: 2012-05-21 | Discharge: 2012-05-21 | Disposition: A | Discharge status: Home / Self Care | Payer: BC Managed Care – PPO | Source: Ambulatory Visit | Attending: Family Medicine | Admitting: Family Medicine

## 2012-05-21 ENCOUNTER — Other Ambulatory Visit: Payer: Self-pay | Admitting: Family Medicine

## 2012-05-21 DIAGNOSIS — M5430 Sciatica, unspecified side: Secondary | ICD-10-CM

## 2012-07-31 ENCOUNTER — Ambulatory Visit (INDEPENDENT_AMBULATORY_CARE_PROVIDER_SITE_OTHER): Payer: BC Managed Care – PPO | Admitting: Family Medicine

## 2012-07-31 VITALS — BP 104/69 | Ht 65.0 in | Wt 118.0 lb

## 2012-07-31 DIAGNOSIS — M533 Sacrococcygeal disorders, not elsewhere classified: Secondary | ICD-10-CM | POA: Insufficient documentation

## 2012-07-31 DIAGNOSIS — M999 Biomechanical lesion, unspecified: Secondary | ICD-10-CM

## 2012-07-31 NOTE — Assessment & Plan Note (Signed)
Left-sided sacral iliac dysfunction. Patient has had this for quite some time and has done multiple different modalities. 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 Patient is also had a corticosteroid injection of the piriformis muscle which did give her some mild benefit for some time. Patient is still having significant tenderness over the left SI joint. Patient did have some osteopathic manipulation done today with some increase in flexibility and mild improvement in symptoms. Patient is going to continue the same home modalities at this time. Patient will come in 2 weeks' time. Likely a followup would like to do is do a ultrasound in this area. We'll do is see if we can look at this as well as the issue tuberosity type pain. Patient could potentially benefit from an SI joint injection under ultrasound guidance.

## 2012-07-31 NOTE — Progress Notes (Signed)
CC: left sided back pain  Subjective: Patient is a new patient here. I have seen this patient the different practice. Patient is coming in with left sacroiliac pain. 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 Patient did have an injection over piriformis muscle with minimal improvement. Patient states that it did help for a couple months but then seemed to come back. Patient states the duration of this in total has been about 9-10 months now. Patient does not remember any specific injury. Patient states that it usually starts at her left SI joint and does have some radiculopathy down the posterior aspect of her leg down to the knee. Patient denies any weakness any bowel or bladder incontinence, any fevers, chills, or abnormal weight loss. Patient is becoming very frustrated with this. Patient did have osteopathic manipulation done by me previously which did help but only short duration. Patient has had an MRI done of her lumbar spine which was unremarkable  Past medical history significant for asthma  Medications include Claritin, Benadryl, Gildess  Allergies include aspirin and anti-inflammatories patient does get hives.  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  Family history: Aunt and uncles have diabetes grandparents have cardiovascular heart disease, mother has hypertension.  Objective Filed Vitals:   07/31/12 1446  BP: 104/69   General: No apparent distress alert and oriented x3 mood and affect normal fit, healthy-appearing female Skin: Intact no erythema or signs of cellulitis Gait: Normal with good balance Back Exam: On inspection patient does not have any scoliosis or abnormalities. Patient has a negative straight leg test bilaterally. Patient is very good hamstring flexibility. Patient  does have some mild palpable tenderness over the left SI joint as well as the initial tuberosities bilaterally left greater than right. Patient has a positive Pearlean Brownie test on the left side. Patient is neurovascularly intact distally with 2+ DTRs of the patellar tendon that are symmetric. Patient is 5 out of 5 strength of the extremities Patient has known leg length discrepancy.  OMT Physical Exam  Standing structural       Occiput neutral  Shoulder left higher  Inferior angle of scapula left higher  Illiac crest left higher  Medial malleolus neutral  Standing flexion  Right Seated Flexion Left Cervical  Not examined Thoracic T4 extended rotated and side bent left Lumbar L2 flexed rotated and side bent right Sacrum Patient has a forward flexion of the sacrum Illium Right posterior ilium

## 2012-07-31 NOTE — Assessment & Plan Note (Signed)
Decision today to treat with OMT was based on Physical Exam  After verbal consent patient was treated with HVLA and ME techniques in sacral areas  Patient tolerated the procedure well with improvement in symptoms  Patient given exercises, stretches and lifestyle modifications  See medications in patient instructions if given  Patient will follow up in 2 weeks

## 2012-08-14 ENCOUNTER — Encounter: Payer: Self-pay | Admitting: Family Medicine

## 2012-08-14 ENCOUNTER — Ambulatory Visit (INDEPENDENT_AMBULATORY_CARE_PROVIDER_SITE_OTHER): Payer: BC Managed Care – PPO | Admitting: Family Medicine

## 2012-08-14 VITALS — BP 111/72 | HR 86 | Ht 65.0 in | Wt 118.0 lb

## 2012-08-14 DIAGNOSIS — M533 Sacrococcygeal disorders, not elsewhere classified: Secondary | ICD-10-CM

## 2012-08-14 DIAGNOSIS — M999 Biomechanical lesion, unspecified: Secondary | ICD-10-CM

## 2012-08-14 MED ORDER — GABAPENTIN 100 MG PO CAPS: ORAL_CAPSULE | ORAL | Status: DC

## 2012-08-14 MED ORDER — TRAMADOL HCL 50 MG PO TABS: 50.0000 mg | ORAL_TABLET | Freq: Three times a day (TID) | ORAL | Status: DC | PRN

## 2012-08-14 NOTE — Assessment & Plan Note (Signed)
Patient continues to have trouble but did have response to manipulation therapy. Patient did after verbal consent have manipulation again with moderate improvement. In addition to this patient was injected in the left SI joint. Patient tolerated the procedure well. Patient will followup in 2-3 weeks to make sure she is improving. 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 Patient given some Neurontin and tramadol prescriptions to have on hand in case she would like to refill them. Warned of potential side effects. We'll reevaluate in 2 weeks time.

## 2012-08-14 NOTE — Assessment & Plan Note (Signed)
Decision today to treat with OMT was based on Physical Exam  After verbal consent patient was treated with HVLA techniques in lumbosacral areas  Patient tolerated the procedure well with improvement in symptoms  Patient given exercises, stretches and lifestyle modifications  See medications in patient instructions if given  Patient will follow up in 2 weeks

## 2012-08-14 NOTE — Progress Notes (Signed)
CC: left sided back pain, SI dysfunction.   Subjective: Patient is coming in for follow up with left sacroiliac pain. 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 Patient did have manipulation 3 weeks ago and did have improvement for 48 hours. Since that time the pain has gradually returned.Patient denies any weakness any bowel or bladder incontinence, any fevers, chills, or abnormal weight loss.  Other modalities tried and PMHx: Patient did have an injection over piriformis muscle with minimal improvement. Patient states that it did help for a couple months but then seemed to come back. Patient states the duration of this in total has been about 9-10 months now.Patient has had an MRI done of her lumbar spine which was unremarkable.   Objective Filed Vitals:   08/14/12 1349  BP: 111/72  Pulse: 86   General: No apparent distress alert and oriented x3 mood and affect normal fit, healthy-appearing female Skin: Intact no erythema or signs of cellulitis Gait: Normal with good balance Back Exam: On inspection patient does not have any scoliosis or abnormalities. Patient has a negative straight leg test bilaterally. Patient has a positive Pearlean Brownie test on the left side. Patient is neurovascularly intact distally with 2+ DTRs of the patellar tendon that are symmetric. Patient is 5 out of 5 strength of the extremities Patient has known leg length discrepancy.  OMT Physical Exam  Standing structural       Occiput neutral  Shoulder neutral  Medial malleolus neutral  Standing flexion  left Seated Flexion Left Cervical  Not examined Thoracic T4 extended rotated and side bent left Lumbar L2 flexed rotated and side bent right Sacrum Patient has a forward flexion of the sacrum Illium right on right  Right posterior ilium   Procedure note After verbal and  written consent given pt was prepped with betadine.  1:1:.5 mixture of lidocain to marcain to kenalog 40 used in left Si joint .  Pt minimal bleeding dressed with band aid, Pt given red flags to look for pt had better pain control immediatly.

## 2012-09-04 ENCOUNTER — Ambulatory Visit (INDEPENDENT_AMBULATORY_CARE_PROVIDER_SITE_OTHER): Payer: BC Managed Care – PPO | Admitting: Family Medicine

## 2012-09-04 VITALS — BP 100/60 | Ht 65.0 in | Wt 120.0 lb

## 2012-09-04 DIAGNOSIS — M533 Sacrococcygeal disorders, not elsewhere classified: Secondary | ICD-10-CM

## 2012-09-04 NOTE — Assessment & Plan Note (Signed)
Patient continues to improve with manipulation therapy. Patient given more exercises and stretching. Patient will continue to come for manipulation therapy as long as he continues to work. Patient will come back in 4-6 weeks for further evaluation.

## 2012-09-04 NOTE — Progress Notes (Signed)
CC: left sided back pain, SI dysfunction.   Subjective: Patient is coming in for follow up with left sacroiliac pain. Patient states that she continues to improve very slowly. Patient has been doing a lot of core exercising as well as stretching and trying to do better. Patient states now the same pain that she is having is when she is sitting. Patient states that the pain is left-sided. Patient states that it's worse after working long hours. Patient though overall think this has continued to improve and does not have radiculopathy that had previously.patient did have injection of the SI joint previously and had significant improvement immediately. Patient states that has continued to improve.  Other modalities tried and PMHx: Patient did have an injection over piriformis muscle with minimal improvement. Patient states that it did help for a couple months but then seemed to come back. Patient states the duration of this in total has been about 9-10 months now.Patient has had an MRI done of her lumbar spine which was unremarkable.   Objective Filed Vitals:   09/04/12 1503  BP: 100/60   General: No apparent distress alert and oriented x3 mood and affect normal fit, healthy-appearing female Skin: Intact no erythema or signs of cellulitis Gait: Normal with good balance Back Exam: On inspection patient does not have any scoliosis or abnormalities. Patient has a negative straight leg test bilaterally. Patient has a positive Pearlean Brownie test on the left side. Patient is neurovascularly intact distally with 2+ DTRs of the patellar tendon that are symmetric. Patient is 5 out of 5 strength of the extremities Patient has known leg length discrepancy.  OMT Physical Exam  Standing structural       Occiput neutral  Shoulder neutral  Medial malleolus neutral  Standing flexion  left Seated Flexion Left Cervical  Not examined Thoracic T4 extended rotated and side bent left Lumbar L2 flexed rotated and  side bent right Sacrum Patient has a forward flexion of the sacrum Illium right on right  Right posterior ilium

## 2012-10-16 ENCOUNTER — Ambulatory Visit (INDEPENDENT_AMBULATORY_CARE_PROVIDER_SITE_OTHER): Payer: BC Managed Care – PPO | Admitting: Family Medicine

## 2012-10-16 VITALS — BP 111/79 | Ht 65.0 in | Wt 120.0 lb

## 2012-10-16 DIAGNOSIS — M999 Biomechanical lesion, unspecified: Secondary | ICD-10-CM

## 2012-10-16 NOTE — Progress Notes (Signed)
CC: left sided back pain, SI dysfunction.   Subjective: Patient is coming in for follow up with left sacroiliac pain. Patient states that she continues to improve. Patient has been doing a lot of core exercising as well as stretching and trying to do better. Patient states pain is worse with sitting. Patient states that the pain is left-sided.  Patient though overall think this has continued to improve and does not have radiculopathy that had previously.  .  Other modalities tried and PMHx: Patient did have an injection over piriformis muscle with minimal improvement. Patient states that it did help for a couple months but then seemed to come back. Patient states the duration of this in total has been about 9-10 months now.Patient has had an MRI done of her lumbar spine which was unremarkable. Patient is also had a injection of the SI joint which was helpful.   Objective Blood pressure 111/79, height 5\' 5"  (1.651 m), weight 120 lb (54.432 kg). General: No apparent distress alert and oriented x3 mood and affect normal fit, healthy-appearing female Skin: Intact no erythema or signs of cellulitis Gait: Normal with good balance Back Exam: On inspection patient does not have any scoliosis or abnormalities. Patient has a negative straight leg test bilaterally. Patient has a positive Pearlean Brownie test on the left side. Patient is neurovascularly intact distally with 2+ DTRs of the patellar tendon that are symmetric. Patient is 5 out of 5 strength of the extremities Patient has known leg length discrepancy.  OMT Physical Exam  Standing structural       Occiput neutral  Shoulder neutral  Medial malleolus neutral             Iliac crest elevated on right  Standing flexion  Right Seated Flexion Left Cervical  C4 flexed rotated and side bent left Thoracic T4 extended rotated and side bent left Lumbar Neutral Sacrum Patient has a forward flexion of the sacrum on right Ilium Right posterior  ilium

## 2012-10-16 NOTE — Assessment & Plan Note (Signed)
Continue the current therapy. 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 This would be the only area of some mild muscle imbalance that can be found today. Patient was taught home exercises today as well. We will see patient again in 6 weeks for further evaluation. Patient did respond to HVLA OMT therapy today in the lumbosacral an ileal regions.

## 2012-10-25 ENCOUNTER — Other Ambulatory Visit: Payer: Self-pay | Admitting: *Deleted

## 2012-10-25 MED ORDER — GABAPENTIN 100 MG PO CAPS: ORAL_CAPSULE | ORAL | Status: DC

## 2012-11-06 ENCOUNTER — Other Ambulatory Visit: Payer: Self-pay | Admitting: Obstetrics and Gynecology

## 2012-11-06 DIAGNOSIS — Z1231 Encounter for screening mammogram for malignant neoplasm of breast: Secondary | ICD-10-CM

## 2012-11-27 ENCOUNTER — Ambulatory Visit (INDEPENDENT_AMBULATORY_CARE_PROVIDER_SITE_OTHER): Payer: BC Managed Care – PPO | Admitting: Family Medicine

## 2012-11-27 VITALS — BP 111/71 | Ht 65.0 in | Wt 118.0 lb

## 2012-11-27 DIAGNOSIS — M999 Biomechanical lesion, unspecified: Secondary | ICD-10-CM

## 2012-11-27 NOTE — Assessment & Plan Note (Signed)
Decision today to treat with OMT was based on Physical Exam  After verbal consent patient was treated with HVLA and ME techniques in lumbar and sacrum areas  Patient tolerated the procedure well with improvement in symptoms  Patient given exercises, stretches and lifestyle modifications  See medications in patient instructions if given  Patient will follow up in 4-6 weeks

## 2012-11-27 NOTE — Progress Notes (Signed)
CC: left sided back pain, SI dysfunction.   Subjective: Patient is coming in for follow up with left sacroiliac pain. Patient states that she continues to improve. Patient did recently over 2 weeks ago had a fall onto her left hip when she was chasing her dog. Patient states that it seems to be improving slowly and would like me to be cautious with manipulation today. Patient continues to focus on core strengthening especially abdominals. Patient states pain is worse with sitting and states she's not been able to go to a movie theater for over a year secondary to this pain.. Patient states that the pain is left-sided.  Patient though overall think this has continued to improve and does not have radiculopathy for quite some time.   Other modalities tried and PMHx: Patient did have an injection over piriformis muscle with minimal improvement. Patient states that it did help for a couple months but then seemed to come back. Patient states the duration of this in total has been about 9-10 months now.Patient has had an MRI done of her lumbar spine which was unremarkable. Patient is also had a injection of the SI joint which was helpful.   Objective Blood pressure 111/71, height 5\' 5"  (1.651 m), weight 118 lb (53.524 kg). General: No apparent distress alert and oriented x3 mood and affect normal fit, healthy-appearing female Skin: Intact no erythema or signs of cellulitis Gait: Normal with good balance Back Exam: On inspection patient does not have any scoliosis or abnormalities. Patient has a negative straight leg test bilaterally. Patient has a positive Pearlean Brownie test on the left side. Patient is neurovascularly intact distally with 2+ DTRs of the patellar tendon that are symmetric. Patient is 5 out of 5 strength of the extremities Patient has known leg length discrepancy.  OMT Physical Exam  Standing structural       Occiput neutral  Shoulder neutral  Medial malleolus neutral             Iliac crest  elevated on right  Standing flexion  Right Seated Flexion Left Cervical  C4 flexed rotated and side bent left Thoracic T4 extended rotated and side bent left Lumbar Neutral Sacrum Patient has a forward flexion of the sacrum on right Ilium Right posterior ilium

## 2012-11-30 ENCOUNTER — Ambulatory Visit
Admission: RE | Admit: 2012-11-30 | Discharge: 2012-11-30 | Disposition: A | Discharge status: Home / Self Care | Payer: BC Managed Care – PPO | Source: Ambulatory Visit | Attending: Obstetrics and Gynecology | Admitting: Obstetrics and Gynecology

## 2012-11-30 DIAGNOSIS — Z1231 Encounter for screening mammogram for malignant neoplasm of breast: Secondary | ICD-10-CM

## 2013-01-08 ENCOUNTER — Ambulatory Visit (INDEPENDENT_AMBULATORY_CARE_PROVIDER_SITE_OTHER): Payer: BC Managed Care – PPO | Admitting: Family Medicine

## 2013-01-08 VITALS — BP 98/64 | Ht 65.0 in | Wt 123.0 lb

## 2013-01-08 DIAGNOSIS — M999 Biomechanical lesion, unspecified: Secondary | ICD-10-CM

## 2013-01-08 NOTE — Assessment & Plan Note (Signed)
Decision today to treat with OMT was based on Physical Exam  After verbal consent patient was treated with HVLA and ME techniques in lumbosacral areas. Patient did have a significant amount of soft tissue release done on the hip flexor today as well.  Patient tolerated the procedure well with improvement in symptoms  Patient given exercises, stretches and lifestyle modifications  See medications in patient instructions if given  Patient will follow up in 4-6 weeks   *continues to give her trouble I would consider doing injection in the ischial tuberosity. We will do an ultrasound at followup and decide if this would help.

## 2013-01-08 NOTE — Progress Notes (Signed)
CC: left sided back pain, SI dysfunction.  Subjective: Patient is coming in for follow up with left sacroiliac pain. Patient states that she continues to improve but slowly. Complaining more of left fascial pain than actual back pain now. Patient continues to focus on core strengthening especially abdominals and has been doing more of a soft tissue active release which has been helpful. Patient states pain is worse with sitting still.  Patient states that the pain is left-sided. Patient though overall think this has continued to improve and does not have radiculopathy for quite some time.    Other modalities tried and PMHx: Patient did have an injection over piriformis muscle with minimal improvement. Patient has had an MRI done of her lumbar spine which was unremarkable. Patient is also had a injection of the SI joint which was helpful.  Objective  Blood pressure 98/64, height 5\' 5"  (1.651 m), weight 123 lb (55.792 kg). General: No apparent distress alert and oriented x3 mood and affect normal fit, healthy-appearing female  Skin: Intact no erythema or signs of cellulitis  Gait: Normal with good balance  Back Exam: On inspection patient does not have any scoliosis or abnormalities. Patient has a negative straight leg test bilaterally. Patient has a positive Pearlean Brownie test on the left side. Patient is neurovascularly intact distally with 2+ DTRs of the patellar tendon that are symmetric.  Patient is 5 out of 5 strength of the extremities  Patient has known leg length discrepancy.  OMT Physical Exam  Standing structural  Occiput neutral  Shoulder neutral  Medial malleolus neutral  Iliac crest elevated on right  Standing flexion  Right  Seated Flexion  Left  Cervical  C4 flexed rotated and side bent left  Thoracic  T4 extended rotated and side bent left  Lumbar  Neutral  Sacrum  Patient has a forward flexion of the sacrum on right  Ilium  Right posterior ilium  Patient is tender to  palpation over the left ischial tuberosity.

## 2013-02-19 ENCOUNTER — Ambulatory Visit (INDEPENDENT_AMBULATORY_CARE_PROVIDER_SITE_OTHER): Payer: BC Managed Care – PPO | Admitting: Family Medicine

## 2013-02-19 VITALS — BP 112/76 | Ht 65.0 in | Wt 123.0 lb

## 2013-02-19 DIAGNOSIS — M999 Biomechanical lesion, unspecified: Secondary | ICD-10-CM

## 2013-02-19 DIAGNOSIS — M76899 Other specified enthesopathies of unspecified lower limb, excluding foot: Secondary | ICD-10-CM

## 2013-02-19 DIAGNOSIS — M7072 Other bursitis of hip, left hip: Secondary | ICD-10-CM

## 2013-02-19 DIAGNOSIS — M7070 Other bursitis of hip, unspecified hip: Secondary | ICD-10-CM | POA: Insufficient documentation

## 2013-02-19 DIAGNOSIS — M533 Sacrococcygeal disorders, not elsewhere classified: Secondary | ICD-10-CM

## 2013-02-19 MED ORDER — METHYLPREDNISOLONE ACETATE 40 MG/ML IJ SUSP: 40.0000 mg | Freq: Once | INTRAMUSCULAR | Status: DC

## 2013-02-19 MED ORDER — METHYLPREDNISOLONE ACETATE 40 MG/ML IJ SUSP
40.0000 mg | Freq: Once | INTRAMUSCULAR | Status: AC
  Administered 2013-02-19: 40 mg via INTRA_ARTICULAR

## 2013-02-19 NOTE — Assessment & Plan Note (Signed)
The patient had an injection as described above. 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 Patient will followup in 4-6 weeks for further evaluation.

## 2013-02-19 NOTE — Addendum Note (Signed)
Addended by: Jacki Cones C on: 02/19/2013 03:35 PM   Modules accepted: Orders

## 2013-02-19 NOTE — Progress Notes (Signed)
CC: left sided back pain, SI dysfunction.  Subjective: Patient is coming in for follow up with left sacroiliac pain. Patient states that she continues to improve but still a lot of pain with sitting. Complaining of pain around the . Patient continues to focus on core strengthening especially abdominals and has been doing more of a soft tissue active release which has been helpful.  Patient states that the pain is left-sided. Patient has been working on hip flexors as well. Patient though overall think this has continued to improve and does not have radiculopathy.   Other modalities tried and PMHx: Patient did have an injection over piriformis muscle with minimal improvement. Patient has had an MRI done of her lumbar spine which was unremarkable. Patient is also had a injection of the SI joint which was helpful.  Objective  Blood pressure 112/76, height 5\' 5"  (1.651 m), weight 123 lb (55.792 kg). General: No apparent distress alert and oriented x3 mood and affect normal fit, healthy-appearing female  Skin: Intact no erythema or signs of cellulitis  Gait: Normal with good balance  Back Exam: On inspection patient does not have any scoliosis or abnormalities. Patient has a negative straight leg test bilaterally. Patient has a positive Pearlean Brownie test on the left side. Patient is neurovascularly intact distally with 2+ DTRs of the patellar tendon that are symmetric.  Patient is 5 out of 5 strength of the extremities  Patient has known leg length discrepancy.  severely TTP over left ischial tuberosity.   OMT Physical Exam  Standing structural  Occiput neutral  Shoulder neutral  Medial malleolus neutral  Iliac crest elevated on right  Standing flexion  Right  Seated Flexion  Left  Cervical  C4 flexed rotated and side bent left  Thoracic  T4 extended rotated and side bent left  Lumbar  Neutral  Sacrum  Patient has a forward flexion of the sacrum on right  Ilium  Right posterior ilium  Patient  is tender to palpation over the left ischial tuberosity.  Musculoskeletal ultrasound was performed and interpreted by me today. Patient did have decent significantly large hypoechoic area on ultrasound corresponding to the ischial tuberosity bursa. This measured approximately 1.5 cm in diameter. After verbal and written consent patient was prepped with 2 alcohol swabs and under ultrasound guidance needle was placed into the bursa area and did have injection of 3 cc of 1% lidocaine and 1 cc of Kenalog 40 into the bursa. Patient tolerated the procedure very well.

## 2013-02-19 NOTE — Assessment & Plan Note (Signed)
The patient had manipulation again today. Patient tolerated the procedure very well. Encourage her to continue to do the exercises as she is doing. Followup from our manipulation in 4-6 weeks.

## 2013-02-19 NOTE — Assessment & Plan Note (Signed)
Decision today to treat with OMT was based on Physical Exam  After verbal consent patient was treated with HVLA, ME, FPR techniques in lumbar, sacral and hip areas  Patient tolerated the procedure well with improvement in symptoms  Patient given exercises, stretches and lifestyle modifications  See medications in patient instructions if given  Patient will follow up in 4-6 weeks

## 2013-03-19 ENCOUNTER — Encounter: Payer: Self-pay | Admitting: Family Medicine

## 2013-03-19 ENCOUNTER — Ambulatory Visit (INDEPENDENT_AMBULATORY_CARE_PROVIDER_SITE_OTHER): Payer: BC Managed Care – PPO | Admitting: Family Medicine

## 2013-03-19 VITALS — Ht 65.0 in | Wt 123.0 lb

## 2013-03-19 DIAGNOSIS — M999 Biomechanical lesion, unspecified: Secondary | ICD-10-CM

## 2013-03-19 NOTE — Assessment & Plan Note (Signed)
Decision today to treat with OMT was based on Physical Exam  After verbal consent patient was treated with HVLA, ME and active release techniques in lumbar and sacral areas  Patient tolerated the procedure well with improvement in symptoms  Patient given exercises, stretches and lifestyle modifications  See medications in patient instructions if given  Patient will follow up in 6-8 weeks

## 2013-03-19 NOTE — Progress Notes (Signed)
CC: left sided back pain, SI dysfunction.  Subjective: Patient is coming in for follow up with left sacroiliac pain and ischial pain.  Patient had steroid injection into the left ischial tuberosity area. Patient tolerated the procedure very well and stated that she has not felt this good in years. Patient is here to for further evaluation. Patient feels that the soft tissue surrounding the area now is somewhat tight and she does get significant improvement with active release techniques.  Patient states that the pain is left-sided. Patient is very happy with her care so far.  Other modalities tried and PMHx: Patient did have an injection over piriformis muscle with minimal improvement. Patient has had an MRI done of her lumbar spine which was unremarkable. Patient is also had a injection of the SI joint which was helpful. Injection into the left ischial tuberosity area for bursitis is very helpful as well.  Objective  Height 5\' 5"  (1.651 m), weight 123 lb (55.792 kg). General: No apparent distress alert and oriented x3 mood and affect normal fit, healthy-appearing female  Skin: Intact no erythema or signs of cellulitis  Gait: Normal with good balance  Back Exam: On inspection patient does not have any scoliosis or abnormalities. Patient has a negative straight leg test bilaterally. Patient has a NEGATIVE  Faber test on the left side. Patient is neurovascularly intact distally with 2+ DTRs of the patellar tendon that are symmetric. Tight hip flexors bilaterally.  Patient is 5 out of 5 strength of the extremities  Patient has known leg length discrepancy.   OMT Physical Exam  Standing structural  Occiput neutral  Shoulder neutral  Medial malleolus neutral  Iliac crest elevated on right  Standing flexion  Right  Seated Flexion  Left  Cervical  C4 flexed rotated and side bent left  Thoracic  T4 extended rotated and side bent left  Lumbar  Neutral  Sacrum  Right on right  Ilium  Right  posterior ilium  Patient is tender to palpation over the left ischial tuberosity.

## 2014-12-02 ENCOUNTER — Other Ambulatory Visit (INDEPENDENT_AMBULATORY_CARE_PROVIDER_SITE_OTHER): Payer: Self-pay | Admitting: Otolaryngology

## 2014-12-02 DIAGNOSIS — J0101 Acute recurrent maxillary sinusitis: Secondary | ICD-10-CM

## 2014-12-05 ENCOUNTER — Ambulatory Visit
Admission: RE | Admit: 2014-12-05 | Discharge: 2014-12-05 | Disposition: A | Discharge status: Home / Self Care | Payer: BLUE CROSS/BLUE SHIELD | Source: Ambulatory Visit | Attending: Otolaryngology | Admitting: Otolaryngology

## 2014-12-05 DIAGNOSIS — J0101 Acute recurrent maxillary sinusitis: Secondary | ICD-10-CM

## 2016-08-24 DIAGNOSIS — Z23 Encounter for immunization: Secondary | ICD-10-CM | POA: Diagnosis not present

## 2016-10-24 DIAGNOSIS — J45909 Unspecified asthma, uncomplicated: Secondary | ICD-10-CM | POA: Diagnosis not present

## 2016-12-12 DIAGNOSIS — R5383 Other fatigue: Secondary | ICD-10-CM | POA: Diagnosis not present

## 2016-12-12 DIAGNOSIS — Z6820 Body mass index (BMI) 20.0-20.9, adult: Secondary | ICD-10-CM | POA: Diagnosis not present

## 2016-12-12 DIAGNOSIS — Z01419 Encounter for gynecological examination (general) (routine) without abnormal findings: Secondary | ICD-10-CM | POA: Diagnosis not present

## 2017-04-26 DIAGNOSIS — R0789 Other chest pain: Secondary | ICD-10-CM | POA: Diagnosis not present

## 2017-08-27 DIAGNOSIS — Z23 Encounter for immunization: Secondary | ICD-10-CM | POA: Diagnosis not present

## 2017-12-18 DIAGNOSIS — Z681 Body mass index (BMI) 19 or less, adult: Secondary | ICD-10-CM | POA: Diagnosis not present

## 2017-12-18 DIAGNOSIS — Z01419 Encounter for gynecological examination (general) (routine) without abnormal findings: Secondary | ICD-10-CM | POA: Diagnosis not present

## 2018-07-25 DIAGNOSIS — B029 Zoster without complications: Secondary | ICD-10-CM | POA: Diagnosis not present

## 2018-10-27 DIAGNOSIS — Z23 Encounter for immunization: Secondary | ICD-10-CM | POA: Diagnosis not present

## 2018-12-29 DIAGNOSIS — J069 Acute upper respiratory infection, unspecified: Secondary | ICD-10-CM | POA: Diagnosis not present

## 2018-12-29 DIAGNOSIS — J209 Acute bronchitis, unspecified: Secondary | ICD-10-CM | POA: Diagnosis not present

## 2019-02-20 DIAGNOSIS — Z01419 Encounter for gynecological examination (general) (routine) without abnormal findings: Secondary | ICD-10-CM | POA: Diagnosis not present

## 2019-02-20 DIAGNOSIS — Z681 Body mass index (BMI) 19 or less, adult: Secondary | ICD-10-CM | POA: Diagnosis not present

## 2019-02-20 DIAGNOSIS — Z1151 Encounter for screening for human papillomavirus (HPV): Secondary | ICD-10-CM | POA: Diagnosis not present

## 2019-02-20 DIAGNOSIS — Z124 Encounter for screening for malignant neoplasm of cervix: Secondary | ICD-10-CM | POA: Diagnosis not present

## 2019-04-03 DIAGNOSIS — Z1231 Encounter for screening mammogram for malignant neoplasm of breast: Secondary | ICD-10-CM | POA: Diagnosis not present

## 2019-09-18 DIAGNOSIS — Z1159 Encounter for screening for other viral diseases: Secondary | ICD-10-CM | POA: Diagnosis not present

## 2019-09-23 DIAGNOSIS — K921 Melena: Secondary | ICD-10-CM | POA: Diagnosis not present

## 2019-09-23 DIAGNOSIS — D122 Benign neoplasm of ascending colon: Secondary | ICD-10-CM | POA: Diagnosis not present

## 2019-09-23 DIAGNOSIS — Z86010 Personal history of colonic polyps: Secondary | ICD-10-CM | POA: Diagnosis not present

## 2019-09-23 DIAGNOSIS — R109 Unspecified abdominal pain: Secondary | ICD-10-CM | POA: Diagnosis not present

## 2019-09-23 DIAGNOSIS — R197 Diarrhea, unspecified: Secondary | ICD-10-CM | POA: Diagnosis not present

## 2019-09-23 DIAGNOSIS — K648 Other hemorrhoids: Secondary | ICD-10-CM | POA: Diagnosis not present

## 2020-03-25 DIAGNOSIS — Z130 Encounter for screening for diseases of the blood and blood-forming organs and certain disorders involving the immune mechanism: Secondary | ICD-10-CM | POA: Diagnosis not present

## 2020-03-25 DIAGNOSIS — Z0000 Encounter for general adult medical examination without abnormal findings: Secondary | ICD-10-CM | POA: Diagnosis not present

## 2020-03-25 DIAGNOSIS — Z13220 Encounter for screening for lipoid disorders: Secondary | ICD-10-CM | POA: Diagnosis not present

## 2020-03-25 DIAGNOSIS — Z01419 Encounter for gynecological examination (general) (routine) without abnormal findings: Secondary | ICD-10-CM | POA: Diagnosis not present

## 2020-03-25 DIAGNOSIS — Z6820 Body mass index (BMI) 20.0-20.9, adult: Secondary | ICD-10-CM | POA: Diagnosis not present

## 2020-06-29 ENCOUNTER — Ambulatory Visit (INDEPENDENT_AMBULATORY_CARE_PROVIDER_SITE_OTHER): Payer: BC Managed Care – PPO

## 2020-06-29 ENCOUNTER — Ambulatory Visit (INDEPENDENT_AMBULATORY_CARE_PROVIDER_SITE_OTHER): Payer: BC Managed Care – PPO | Admitting: Family Medicine

## 2020-06-29 ENCOUNTER — Other Ambulatory Visit: Payer: Self-pay

## 2020-06-29 ENCOUNTER — Ambulatory Visit: Payer: Self-pay

## 2020-06-29 ENCOUNTER — Encounter: Payer: Self-pay | Admitting: Family Medicine

## 2020-06-29 VITALS — BP 110/72 | HR 97 | Ht 65.0 in | Wt 122.6 lb

## 2020-06-29 DIAGNOSIS — M25551 Pain in right hip: Secondary | ICD-10-CM

## 2020-06-29 DIAGNOSIS — M25552 Pain in left hip: Secondary | ICD-10-CM

## 2020-06-29 MED ORDER — NORTRIPTYLINE HCL 25 MG PO CAPS: 25.0000 mg | ORAL_CAPSULE | Freq: Every day | ORAL | 2 refills | Status: DC

## 2020-06-29 NOTE — Progress Notes (Signed)
X-ray right hip shows no significant arthritis.  Looks pretty normal to radiology.  If not better after injection would recommend MRI.

## 2020-06-29 NOTE — Progress Notes (Signed)
I, Christoper Fabian, LAT, ATC, am serving as scribe for Dr. Clementeen Graham.  Traci Wyatt is a 44 y.o. female who presents to [facility] today for B hip and low back pain.  She was last seen by Dr. Katrinka Blazing in 2014 for L-sided LBP and SIJ pain and had OMT.  Currently she notes, B hip and low back pain that has been progressively worsening x 3 weeks.  She reports chronic L hip pain and then R hip began about 6 months ago.  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  Pain is considerable and interfering with sleep.  Radiating pain: yes into B thighs LE numbness/tingling: No LE weakness: No Aggravating factors: sitting; R and L sidelying Treatments tried: massage; heat, ice, Biofreeze; stretching  Diagnostic testing: L-spine MRI- 05/21/12  Pertinent review of systems: No fevers or chills  Relevant historical information: History SI joint dysfunction and ischial bursitis left side.   Exam:  BP 110/72   Pulse 97   Ht 5\' 5"  (1.651 m)   Wt 122 lb 9.6 oz (55.6 kg)   SpO2 99%   BMI 20.40 kg/m  General: Well Developed, well nourished, and in no acute distress.   MSK: L-spine normal-appearing nontender normal motion. Right hip normal-appearing normal motion.  Mildly tender palpation at superior portion of greater trochanter. Excellent hip strength to abduction and external rotation and internal rotation and adduction.  Lateral left hip normal-appearing normal motion not particularly tender. Normal strength as above.  Leg lengths equal.    Lab and Radiology Results  X-ray images right hip obtained today personally independently reviewed Potential lucency superior to the acetabulum of the right hip.  Otherwise normal-appearing Await formal radiology review  Procedure: Real-time Ultrasound Guided Injection  of right lateral hip greater trochanter insertion of gluteus medius and greater trochanter bursa Device: Philips Affiniti 50G Images permanently stored and available for review in PACS Verbal informed consent obtained.  Discussed risks and benefits of procedure. Warned about infection bleeding damage to structures skin hypopigmentation and fat atrophy among others. Patient expresses understanding and agreement Time-out conducted.   Noted no overlying erythema, induration, or other signs of local infection.   Skin prepped in a sterile fashion.   Local anesthesia: Topical Ethyl chloride.   With sterile technique and under real time ultrasound guidance:  40 mg of Kenalog and 2 mL of Marcaine injected easily.   Completed without difficulty   Pain partially immediately resolved suggesting accurate placement of the medication.   Advised to call if fevers/chills, erythema, induration, drainage, or persistent bleeding.   Images permanently stored and available for review in the ultrasound unit.  Impression: Technically successful ultrasound guided injection.       Assessment and Plan: 44 y.o. female with right lateral hip pain.  Location of pain is typical for bursitis or tendinitis of the hip abductors.  However she has been doing an excellent job herself of physical therapy and has excellent hip strength. Plan for injection as above.  X-ray today pretty normal but I am little worried about a potential lucency in the ilium superior to the acetabulum.  Await formal radiology review may consider earlier MRI than otherwise indicated.  If not better following injection would proceed with MRI at this point anyway.  Check back as needed.  Additionally patient notes that she is  having pain at night that interfere with sleep.  Trial of nortriptyline.   PDMP not reviewed this encounter. Orders Placed This Encounter  Procedures  . [place] LIMITED JOINT SPACE STRUCTURES LOW BILAT(NO LINKED CHARGES)    Order  Specific Question:   Reason for Exam (SYMPTOM  OR DIAGNOSIS REQUIRED)    Answer:   B hip pain    Order Specific Question:   Preferred imaging location?    Answer:   [facility]  . DG HIP UNILAT WITH PELVIS 2-3 VIEWS RIGHT    Standing Status:   Future    Number of Occurrences:   1    Standing Expiration Date:   06/29/2021    Order Specific Question:   Reason for Exam (SYMPTOM  OR DIAGNOSIS REQUIRED)    Answer:   eval right hip    Order Specific Question:   Is patient pregnant?    Answer:   No    Order Specific Question:   Preferred imaging location?    Answer:   [facility]    Order Specific Question:   Radiology Contrast Protocol - do NOT remove file path    Answer:   [email or web address]   Meds ordered this encounter  Medications  . DISCONTD: nortriptyline (PAMELOR) 25 MG capsule    Sig: Take 1 capsule (25 mg total) by mouth at bedtime.    Dispense:  30 capsule    Refill:  2    For pain and sleep  . nortriptyline (PAMELOR) 25 MG capsule    Sig: Take 1 capsule (25 mg total) by mouth at bedtime.    Dispense:  30 capsule    Refill:  2    For pain and sleep     Discussed warning signs or symptoms. Please see discharge instructions. Patient expresses understanding.   The above documentation has been reviewed and is accurate and complete Clementeen Graham, M.D.

## 2020-06-29 NOTE — Patient Instructions (Addendum)
Thank you for coming in today. Get xray today on the way out.  Call or go to the ER if you develop a large red swollen joint with extreme pain or oozing puss.  Continue your PT.  If not good enough let me know and I will order MRI.  Ok to return as needed for left hip injection (if the right one works).  Recommend adding iontophoresis.    Try nortriptyline at bedtime.  Let me know if more annoying than helpful.

## 2020-07-02 ENCOUNTER — Telehealth: Payer: Self-pay | Admitting: Family Medicine

## 2020-07-02 MED ORDER — AMITRIPTYLINE HCL 25 MG PO TABS: 25.0000 mg | ORAL_TABLET | Freq: Every evening | ORAL | 1 refills | Status: DC | PRN

## 2020-07-02 NOTE — Telephone Encounter (Signed)
Returned pt's call and she verbalizes understanding about the change to amitriptyline.  She asks if this medication will also help with pain because it is the pain that is preventing her from sleeping.  Inform her that I will advise Dr. Denyse Amass and if he chooses to prescribe an additional pain medication, then he will.  [facility] on [address] is the correct pharmacy.

## 2020-07-02 NOTE — Telephone Encounter (Signed)
Pt called, the medication RX'd for sleep is not working at all. Pt requesting an alternative.

## 2020-07-02 NOTE — Telephone Encounter (Signed)
I sent in a cousin of nortriptyline called amitriptyline.  It is a little more effective for sleeping.  Try taking 1 or 2 pills at bedtime.  If that does not work already to annoying let me know I still have good alternatives.

## 2020-07-03 NOTE — Telephone Encounter (Signed)
Patient called back. I gave patient MD response.  She expressed understanding. She said that she will try the Amitriptyline this weekend an let us know how she felt over the weekend.

## 2020-07-03 NOTE — Telephone Encounter (Signed)
Called and LM for pt to return call to discuss medication options.  Please see Dr. Zollie Pee last response regarding fact that amitriptyline will also help w/ pain but wanting to know if she wants something stronger like hyrdrocodone if she can actually tolerate that medication.  Please advise pt of Dr. Zollie Pee response and gather pertinent info.

## 2020-07-03 NOTE — Telephone Encounter (Signed)
Amitriptyline should help with pain.  If not I can prescribe stronger pain medicines like opiates if needed.  Usually I would use a medicine like hydrocodone but I see that you are allergic to oxycodone.  If ibuprofen or Aleve in addition to amitriptyline is not sufficient I will be happy to prescribe 1 of these medications.  Can you tolerate hydrocodone?

## 2021-01-27 DIAGNOSIS — G4700 Insomnia, unspecified: Secondary | ICD-10-CM | POA: Diagnosis not present

## 2021-01-27 DIAGNOSIS — M25551 Pain in right hip: Secondary | ICD-10-CM | POA: Diagnosis not present

## 2021-01-27 DIAGNOSIS — R002 Palpitations: Secondary | ICD-10-CM | POA: Diagnosis not present

## 2021-02-09 ENCOUNTER — Telehealth: Payer: Self-pay

## 2021-02-09 NOTE — Telephone Encounter (Signed)
NOTES ON FILE FROM DR Clementeen Graham , REFERRAL IN PROFICIENT

## 2021-03-23 DIAGNOSIS — R002 Palpitations: Secondary | ICD-10-CM | POA: Insufficient documentation

## 2021-03-23 NOTE — Progress Notes (Signed)
Cardiology Office Note   Date:  03/24/2021   ID:  Traci Wyatt, DOB 02/12/1976, MRN [id number]  PCP:  Tally Joe, MD  Cardiologist:   None Referring:  Scifres, Peter Minium  Chief Complaint  Patient presents with  . Palpitations      History of Present Illness: Traci Wyatt is a 45 y.o. female who is referred by Scifres, Nicole Cella, PA-C for evaluation of palpitations.  The patient has no past cardiac history other than she reports an echo about 13 years ago.  She had significant asthma at that time and was being evaluated.  He was told her echocardiogram was unremarkable but I cannot pull up these results.  She had a long history of orthostatic hypotension symptoms.  I do not know if there is been formal study or diagnosis.  However, she feels like she is lightheaded when she stands up.  She has had bilateral shoulder pain and sees a sports doctor and has been unable to sleep because of this and also insomnia.  She was placed on Elavil and symptoms of tachycardia worsen.  She would notice her heart rate being elevated sometimes at baseline and very often with standing.  It might be 120 due to standing.  It ranges from the 40s to the 140s on her wearable.  She does not necessarily feel it is associated with the orthostatic symptoms or any drops in blood pressure.  She is not really checked her blood pressure when her heart rate is very elevated.  She is very physically active.  She has an exercise routine most days of the week.  She has not had any frank syncope because when she gets orthostatic she can down or avoid going completely.  She has not had any chest pressure, neck or arm discomfort.  She has not had any new shortness of breath, PND or orthopnea.  She does have fluttering in her chest which she feels when isolated skipped beat.  These are random.  It does not seem to be a trigger.  Of note she stopped the Elavil and has been switched to meds as listed below and  some of her tachycardia has improved.  Past Medical History:  Diagnosis Date  . Asthma   . Endometriosis     Past Surgical History:  Procedure Laterality Date  . Endometriosis surgery    . SHOULDER SURGERY     Left     Prior to Admission medications   Medication Sig Start Date End Date Taking? Authorizing Provider  hydrOXYzine (ATARAX/VISTARIL) 25 MG tablet Take 25-50 mg by mouth at bedtime as needed. 02/01/21  Yes [provider]  norethindrone-ethinyl estradiol (LOESTRIN) 1-20 MG-MCG tablet Take 1 tablet by mouth daily.   Yes [provider]    Allergies:   Aspirin, Celecoxib, and Oxycodone-acetaminophen    Social History:  The patient  reports that she has never smoked. She has never used smokeless tobacco.   Family History:  The patient's family history includes Chronic Renal Failure in her mother; Heart attack (age of onset: 14) in her father; Stroke in her mother.    ROS:  Please see the history of present illness.   Otherwise, review of systems are positive for none.   All other systems are reviewed and negative.    PHYSICAL EXAM: VS:  BP 118/74   Pulse 72   Ht 5\' 5"  (1.651 m)   Wt 124 lb 6.4 oz (56.4 kg)   SpO2 99%  BMI 20.70 kg/m  , BMI Body mass index is 20.7 kg/m. GENERAL:  Well appearing HEENT:  Pupils equal round and reactive, fundi not visualized, oral mucosa unremarkable NECK:  No jugular venous distention, waveform within normal limits, carotid upstroke brisk and symmetric, no bruits, no thyromegaly LYMPHATICS:  No cervical, inguinal adenopathy LUNGS:  Clear to auscultation bilaterally BACK:  No CVA tenderness CHEST:  Unremarkable HEART:  PMI not displaced or sustained,S1 and S2 within normal limits, no S3, no S4, no clicks, no rubs, no murmurs ABD:  Flat, positive bowel sounds normal in frequency in pitch, no bruits, no rebound, no guarding, no midline pulsatile mass, no hepatomegaly, no splenomegaly EXT:  2 plus pulses throughout,  no edema, no cyanosis no clubbing SKIN:  No rashes no nodules NEURO:  Cranial nerves II through XII grossly intact, motor grossly intact throughout PSYCH:  Cognitively intact, oriented to person place and time    EKG:  EKG is ordered today. The ekg ordered today demonstrates Normal sinus rhythm, rate 72, axis within normal limits, borderline low voltage in the limb in chest leads.  RSR prime V1 V2, poor anterior R wave progression, no acute ST-T wave changes.   Recent Labs: No results found for requested labs within last 8760 hours.    Lipid Panel No results found for: CHOL, TRIG, HDL, CHOLHDL, VLDL, LDLCALC, LDLDIRECT    Wt Readings from Last 3 Encounters:  03/24/21 124 lb 6.4 oz (56.4 kg)  06/29/20 122 lb 9.6 oz (55.6 kg)  03/19/13 123 lb (55.8 kg)      Other studies Reviewed: Additional studies/ records that were reviewed today include: Primary care records, labs. Review of the above records demonstrates:  Please see elsewhere in the note.     ASSESSMENT AND PLAN:  ORTHOSTATIC HYPOTENSION: She was not orthostatic.  We had a long discussion about the nonmedical therapies of this.  PALPITATIONS:   He had normal electrolytes.  TSH was normal.  She has not been anemic by her report though I do not see these labs.  I am going to apply a 2-week monitor.  I suspect she is having some premature atrial contractions and probably some sinus tachycardia.  The tachycardia far has improved and she is off of Elavil.  She is going to also get a wearable they can do an EKG.  FAMILY HISTORY OF EARLY CAD: We talked about diet and exercise and I think she is to space well in all of these.  Her lipids are excellent.  She is lower risk so does not need a coronary calcium score at this point.   Current medicines are reviewed at length with the patient today.  The patient does not have concerns regarding medicines.  The following changes have been made:  no change  Labs/ tests ordered today  include:   Orders Placed This Encounter  Procedures  . LONG TERM MONITOR (3-14 DAYS)  . EKG 12-Lead     Disposition:   FU with me as needed.      Signed, Rollene Rotunda, MD  03/24/2021 10:21 AM    [facility]

## 2021-03-24 ENCOUNTER — Ambulatory Visit (INDEPENDENT_AMBULATORY_CARE_PROVIDER_SITE_OTHER): Payer: BC Managed Care – PPO | Admitting: Cardiology

## 2021-03-24 ENCOUNTER — Encounter: Payer: Self-pay | Admitting: Cardiology

## 2021-03-24 ENCOUNTER — Ambulatory Visit (INDEPENDENT_AMBULATORY_CARE_PROVIDER_SITE_OTHER): Payer: BC Managed Care – PPO

## 2021-03-24 ENCOUNTER — Other Ambulatory Visit: Payer: Self-pay

## 2021-03-24 VITALS — BP 118/74 | HR 72 | Ht 65.0 in | Wt 124.4 lb

## 2021-03-24 DIAGNOSIS — Z049 Encounter for examination and observation for unspecified reason: Secondary | ICD-10-CM | POA: Diagnosis not present

## 2021-03-24 DIAGNOSIS — R002 Palpitations: Secondary | ICD-10-CM

## 2021-03-24 DIAGNOSIS — I951 Orthostatic hypotension: Secondary | ICD-10-CM | POA: Diagnosis not present

## 2021-03-24 DIAGNOSIS — G43019 Migraine without aura, intractable, without status migrainosus: Secondary | ICD-10-CM | POA: Diagnosis not present

## 2021-03-24 DIAGNOSIS — Z79899 Other long term (current) drug therapy: Secondary | ICD-10-CM | POA: Diagnosis not present

## 2021-03-24 NOTE — Patient Instructions (Addendum)
Medication Instructions:  Your physician recommends that you continue on your current medications as directed. Please refer to the Current Medication list given to you today.   Labwork: NONE  Testing/Procedures: 14 DAY ZIO PATCH   Follow-Up: AS NEEDED   Any Other Special Instructions Will Be Listed Below (If Applicable).  ZIO XT- Long Term Monitor Instructions   Your physician has requested you wear a ZIO patch monitor for  _14_ days.  This is a single patch monitor.   [employer] supplies one patch monitor per enrollment. Additional stickers are not available. Please do not apply patch if you will be having a Nuclear Stress Test, Echocardiogram, Cardiac CT, MRI, or Chest Xray during the period you would be wearing the monitor. The patch cannot be worn during these tests. You cannot remove and re-apply the ZIO XT patch monitor.  Your ZIO patch monitor will be sent Fed Ex from [employer] directly to your home address. It may take 3-5 days to receive your monitor after you have been enrolled.  Once you have received your monitor, please review the enclosed instructions. Your monitor has already been registered assigning a specific monitor serial # to you.  Billing and Patient Assistance Program Information   We have supplied [employer] with any of your insurance information on file for billing purposes. [employer] offers a sliding scale Patient Assistance Program for patients that do not have insurance, or whose insurance does not completely cover the cost of the ZIO monitor.   You must apply for the Patient Assistance Program to qualify for this discounted rate.     To apply, please call [employer] at [phone], select option 4, then select option 2, and ask to apply for Patient Assistance Program.  Meredeth Ide will ask your household income, and how many people are in your household.  They will quote your out-of-pocket cost based on that information.  [employer] will also be able to set up a  66-month, interest-free payment plan if needed.  Applying the monitor   Shave hair from upper left chest.  Hold abrader disc by orange tab. Rub abrader in 40 strokes over the upper left chest as indicated in your monitor instructions.  Clean area with 4 enclosed alcohol pads. Let dry.  Apply patch as indicated in monitor instructions. Patch will be placed under collarbone on left side of chest with arrow pointing upward.  Rub patch adhesive wings for 2 minutes. Remove white label marked "1". Remove the white label marked "2". Rub patch adhesive wings for 2 additional minutes.  While looking in a mirror, press and release button in center of patch. A small green light will flash 3-4 times. This will be your only indicator that the monitor has been turned on. ?  Do not shower for the first 24 hours. You may shower after the first 24 hours.  Press the button if you feel a symptom. You will hear a small click. Record Date, Time and Symptom in the Patient Logbook.  When you are ready to remove the patch, follow instructions on the last 2 pages of the Patient Logbook. Stick patch monitor onto the last page of Patient Logbook.  Place Patient Logbook in the blue and white box.  Use locking tab on box and tape box closed securely.  The blue and white box has prepaid postage on it. Please place it in the mailbox as soon as possible. Your physician should have your test results approximately 7 days after the monitor has been mailed  back to [facility].  Call [facility] Customer Care at [phone] if you have questions regarding your ZIO XT patch monitor. Call them immediately if you see an orange light blinking on your monitor.  If your monitor falls off in less than 4 days, contact our Monitor department at [phone]. ?If your monitor becomes loose or falls off after 4 days call [employer] at [phone] for suggestions on securing your monitor.?

## 2021-03-28 DIAGNOSIS — R002 Palpitations: Secondary | ICD-10-CM | POA: Diagnosis not present

## 2021-04-21 DIAGNOSIS — G43019 Migraine without aura, intractable, without status migrainosus: Secondary | ICD-10-CM | POA: Diagnosis not present

## 2021-06-09 DIAGNOSIS — Z113 Encounter for screening for infections with a predominantly sexual mode of transmission: Secondary | ICD-10-CM | POA: Diagnosis not present

## 2021-06-09 DIAGNOSIS — Z124 Encounter for screening for malignant neoplasm of cervix: Secondary | ICD-10-CM | POA: Diagnosis not present

## 2021-06-09 DIAGNOSIS — Z01419 Encounter for gynecological examination (general) (routine) without abnormal findings: Secondary | ICD-10-CM | POA: Diagnosis not present

## 2021-06-09 DIAGNOSIS — Z3041 Encounter for surveillance of contraceptive pills: Secondary | ICD-10-CM | POA: Diagnosis not present

## 2021-06-09 DIAGNOSIS — Z1231 Encounter for screening mammogram for malignant neoplasm of breast: Secondary | ICD-10-CM | POA: Diagnosis not present

## 2021-06-09 DIAGNOSIS — Z01411 Encounter for gynecological examination (general) (routine) with abnormal findings: Secondary | ICD-10-CM | POA: Diagnosis not present

## 2021-06-09 DIAGNOSIS — Z6820 Body mass index (BMI) 20.0-20.9, adult: Secondary | ICD-10-CM | POA: Diagnosis not present

## 2021-07-28 DIAGNOSIS — G43019 Migraine without aura, intractable, without status migrainosus: Secondary | ICD-10-CM | POA: Diagnosis not present

## 2021-09-07 DIAGNOSIS — Z0000 Encounter for general adult medical examination without abnormal findings: Secondary | ICD-10-CM | POA: Diagnosis not present

## 2021-09-07 DIAGNOSIS — R5383 Other fatigue: Secondary | ICD-10-CM | POA: Diagnosis not present

## 2021-09-07 DIAGNOSIS — E559 Vitamin D deficiency, unspecified: Secondary | ICD-10-CM | POA: Diagnosis not present

## 2021-09-07 DIAGNOSIS — G4700 Insomnia, unspecified: Secondary | ICD-10-CM | POA: Diagnosis not present

## 2021-09-07 DIAGNOSIS — R748 Abnormal levels of other serum enzymes: Secondary | ICD-10-CM | POA: Diagnosis not present

## 2021-09-10 NOTE — Telephone Encounter (Signed)
Please advise 

## 2021-09-24 DIAGNOSIS — H579 Unspecified disorder of eye and adnexa: Secondary | ICD-10-CM | POA: Diagnosis not present

## 2021-09-24 DIAGNOSIS — G43909 Migraine, unspecified, not intractable, without status migrainosus: Secondary | ICD-10-CM | POA: Diagnosis not present

## 2021-09-24 DIAGNOSIS — H5702 Anisocoria: Secondary | ICD-10-CM | POA: Diagnosis not present

## 2021-09-27 ENCOUNTER — Other Ambulatory Visit: Payer: Self-pay | Admitting: Family Medicine

## 2021-09-27 DIAGNOSIS — H5702 Anisocoria: Secondary | ICD-10-CM

## 2021-09-27 DIAGNOSIS — G43909 Migraine, unspecified, not intractable, without status migrainosus: Secondary | ICD-10-CM

## 2021-10-11 ENCOUNTER — Other Ambulatory Visit: Payer: BC Managed Care – PPO

## 2021-10-13 DIAGNOSIS — G4700 Insomnia, unspecified: Secondary | ICD-10-CM | POA: Diagnosis not present

## 2021-10-16 ENCOUNTER — Other Ambulatory Visit: Payer: Self-pay

## 2021-10-16 ENCOUNTER — Ambulatory Visit
Admission: RE | Admit: 2021-10-16 | Discharge: 2021-10-16 | Disposition: A | Discharge status: Home / Self Care | Payer: BC Managed Care – PPO | Source: Ambulatory Visit | Attending: Family Medicine | Admitting: Family Medicine

## 2021-10-16 DIAGNOSIS — H5702 Anisocoria: Secondary | ICD-10-CM

## 2021-10-16 DIAGNOSIS — R519 Headache, unspecified: Secondary | ICD-10-CM | POA: Diagnosis not present

## 2021-10-16 DIAGNOSIS — G43909 Migraine, unspecified, not intractable, without status migrainosus: Secondary | ICD-10-CM

## 2021-10-27 DIAGNOSIS — G43019 Migraine without aura, intractable, without status migrainosus: Secondary | ICD-10-CM | POA: Diagnosis not present

## 2021-11-24 DIAGNOSIS — R232 Flushing: Secondary | ICD-10-CM | POA: Diagnosis not present

## 2021-11-24 DIAGNOSIS — G4700 Insomnia, unspecified: Secondary | ICD-10-CM | POA: Diagnosis not present

## 2021-11-24 DIAGNOSIS — M25559 Pain in unspecified hip: Secondary | ICD-10-CM | POA: Diagnosis not present

## 2021-12-06 DIAGNOSIS — J029 Acute pharyngitis, unspecified: Secondary | ICD-10-CM | POA: Diagnosis not present

## 2021-12-06 DIAGNOSIS — U071 COVID-19: Secondary | ICD-10-CM | POA: Diagnosis not present

## 2021-12-06 DIAGNOSIS — R0981 Nasal congestion: Secondary | ICD-10-CM | POA: Diagnosis not present

## 2021-12-29 DIAGNOSIS — M25559 Pain in unspecified hip: Secondary | ICD-10-CM | POA: Diagnosis not present

## 2021-12-29 DIAGNOSIS — G4700 Insomnia, unspecified: Secondary | ICD-10-CM | POA: Diagnosis not present

## 2021-12-29 DIAGNOSIS — R232 Flushing: Secondary | ICD-10-CM | POA: Diagnosis not present

## 2022-03-02 DIAGNOSIS — R232 Flushing: Secondary | ICD-10-CM | POA: Diagnosis not present

## 2022-03-02 DIAGNOSIS — G4700 Insomnia, unspecified: Secondary | ICD-10-CM | POA: Diagnosis not present

## 2022-04-27 DIAGNOSIS — G43019 Migraine without aura, intractable, without status migrainosus: Secondary | ICD-10-CM | POA: Diagnosis not present

## 2022-05-04 DIAGNOSIS — R232 Flushing: Secondary | ICD-10-CM | POA: Diagnosis not present

## 2022-05-04 DIAGNOSIS — G4700 Insomnia, unspecified: Secondary | ICD-10-CM | POA: Diagnosis not present

## 2022-05-18 DIAGNOSIS — G479 Sleep disorder, unspecified: Secondary | ICD-10-CM | POA: Diagnosis not present

## 2022-06-01 DIAGNOSIS — G479 Sleep disorder, unspecified: Secondary | ICD-10-CM | POA: Diagnosis not present

## 2022-06-15 DIAGNOSIS — Z1231 Encounter for screening mammogram for malignant neoplasm of breast: Secondary | ICD-10-CM | POA: Diagnosis not present

## 2022-06-15 DIAGNOSIS — Z6820 Body mass index (BMI) 20.0-20.9, adult: Secondary | ICD-10-CM | POA: Diagnosis not present

## 2022-06-15 DIAGNOSIS — Z01419 Encounter for gynecological examination (general) (routine) without abnormal findings: Secondary | ICD-10-CM | POA: Diagnosis not present

## 2022-06-15 DIAGNOSIS — Z0142 Encounter for cervical smear to confirm findings of recent normal smear following initial abnormal smear: Secondary | ICD-10-CM | POA: Diagnosis not present

## 2022-09-09 DIAGNOSIS — G4710 Hypersomnia, unspecified: Secondary | ICD-10-CM | POA: Diagnosis not present

## 2022-10-11 DIAGNOSIS — G478 Other sleep disorders: Secondary | ICD-10-CM | POA: Diagnosis not present

## 2022-10-11 DIAGNOSIS — G4700 Insomnia, unspecified: Secondary | ICD-10-CM | POA: Diagnosis not present

## 2022-10-18 DIAGNOSIS — E7800 Pure hypercholesterolemia, unspecified: Secondary | ICD-10-CM | POA: Diagnosis not present

## 2022-10-18 DIAGNOSIS — E559 Vitamin D deficiency, unspecified: Secondary | ICD-10-CM | POA: Diagnosis not present

## 2022-10-18 DIAGNOSIS — Z0000 Encounter for general adult medical examination without abnormal findings: Secondary | ICD-10-CM | POA: Diagnosis not present

## 2022-11-02 DIAGNOSIS — G43019 Migraine without aura, intractable, without status migrainosus: Secondary | ICD-10-CM | POA: Diagnosis not present

## 2022-11-03 DIAGNOSIS — R0683 Snoring: Secondary | ICD-10-CM | POA: Diagnosis not present

## 2022-11-23 DIAGNOSIS — D72829 Elevated white blood cell count, unspecified: Secondary | ICD-10-CM | POA: Diagnosis not present

## 2023-01-18 DIAGNOSIS — G478 Other sleep disorders: Secondary | ICD-10-CM | POA: Diagnosis not present

## 2023-01-18 DIAGNOSIS — G4700 Insomnia, unspecified: Secondary | ICD-10-CM | POA: Diagnosis not present

## 2023-02-01 DIAGNOSIS — G4700 Insomnia, unspecified: Secondary | ICD-10-CM | POA: Diagnosis not present

## 2023-05-17 DIAGNOSIS — G43019 Migraine without aura, intractable, without status migrainosus: Secondary | ICD-10-CM | POA: Diagnosis not present

## 2023-08-03 DIAGNOSIS — Z01419 Encounter for gynecological examination (general) (routine) without abnormal findings: Secondary | ICD-10-CM | POA: Diagnosis not present

## 2023-08-03 DIAGNOSIS — Z1331 Encounter for screening for depression: Secondary | ICD-10-CM | POA: Diagnosis not present

## 2023-08-03 DIAGNOSIS — Z1231 Encounter for screening mammogram for malignant neoplasm of breast: Secondary | ICD-10-CM | POA: Diagnosis not present

## 2023-10-19 IMAGING — MR MR HEAD W/O CM
9 series · 48 of 48 positions shown · non-contrast
Comparison: Face CT 12/05/2014.  Cervical spine MRI 07/13/2007.

CLINICAL DATA: 45-year-old female with blurred vision and headache
for 3-6 months. No known injury.

EXAM:
MRI HEAD WITHOUT CONTRAST
TECHNIQUE: Multiplanar, multiecho pulse sequences of the brain and surrounding
structures were obtained without intravenous contrast.

[Series 2: T1 · sagittal · 5.0mm · 0.45mm/px · 3 of 25 slices shown (1 of 2)]
[im 1/25]
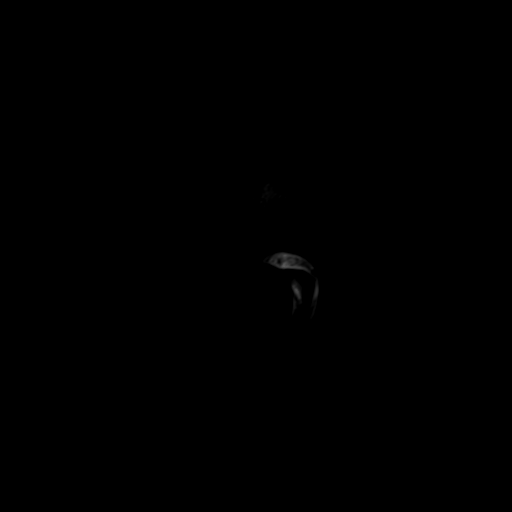
[im 13/25]
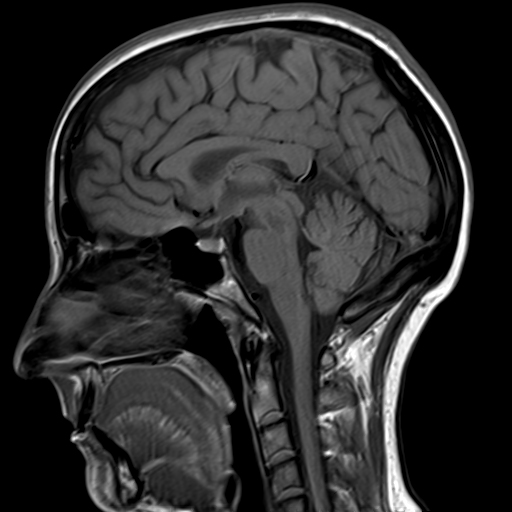
[im 25/25]
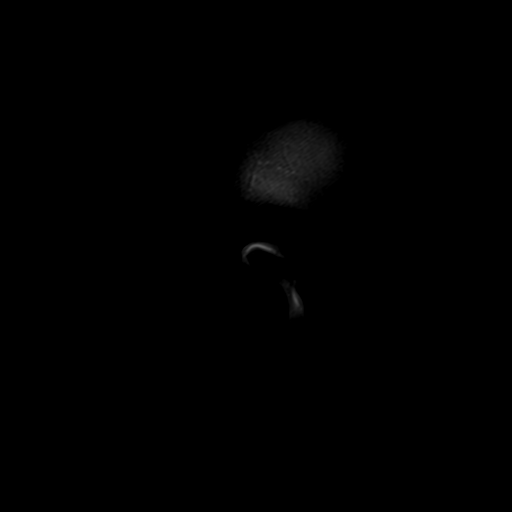

[Series 3: ax ep2d_diff_3 · axial · 3.0mm · 1.80mm/px · z∈[-50,+96]mm · 9 of 100 slices shown]
[im 1/100]
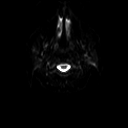
[im 13/100]
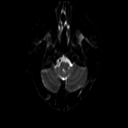
[im 25/100]
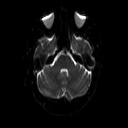
[im 38/100]
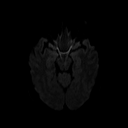
[im 50/100]
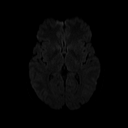
[im 62/100]
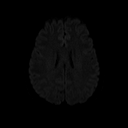
[im 75/100]
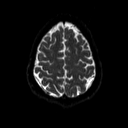
[im 87/100]
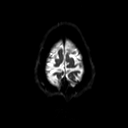
[im 100/100]
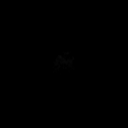

[Series 4: ax ep2d_diff_3_adc · axial · 3.0mm · 1.80mm/px · z∈[-50,+96]mm · 5 of 49 slices shown]
[im 1/49]
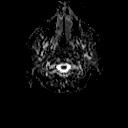
[im 13/49]
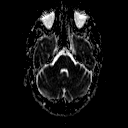
[im 25/49]
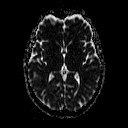
[im 37/49]
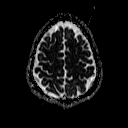
[im 49/49]
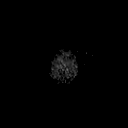

[Series 5: cor ep2d_diffusion · coronal · 5.0mm · 1.77mm/px · 5 of 58 slices shown]
[im 1/58]
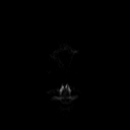
[im 15/58]
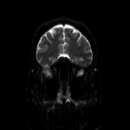
[im 29/58]
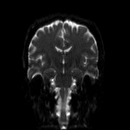
[im 43/58]
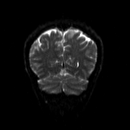
[im 58/58]
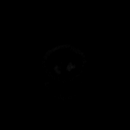

[Series 6: cor ep2d_diffusion_adc · coronal · 5.0mm · 1.77mm/px · 3 of 29 slices shown]
[im 1/29]
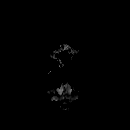
[im 15/29]
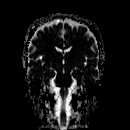
[im 29/29]
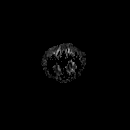

[Series 7: T2 · axial · 5.0mm · 0.51mm/px · z∈[-53,+97]mm · 2 of 26 slices shown]
[im 1/26]
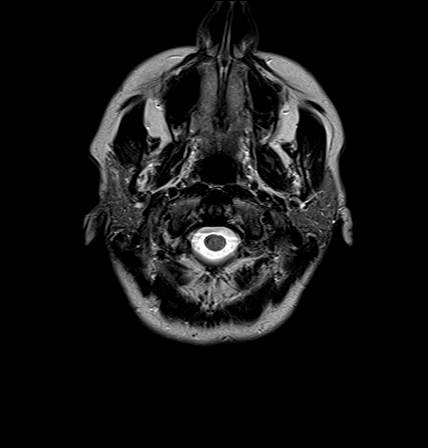
[im 26/26]
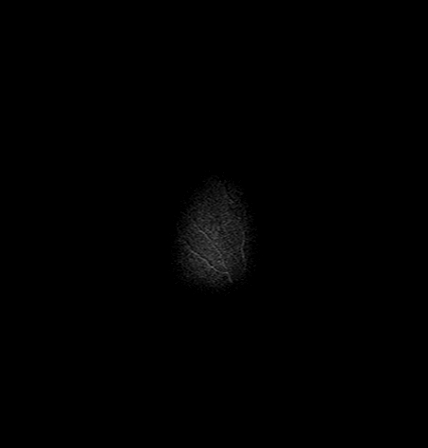

[Series 9: swi_images · axial · 3.0mm · 0.90mm/px · z∈[-47,+94]mm · 4 of 48 slices shown]
[im 1/48]
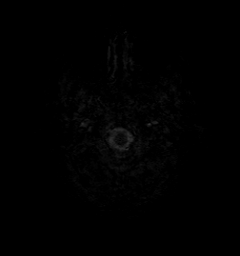
[im 16/48]
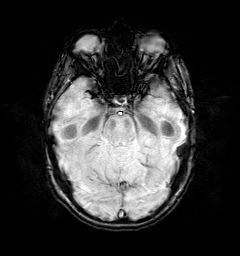
[im 32/48]
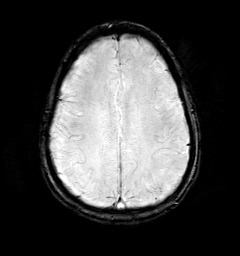
[im 48/48]
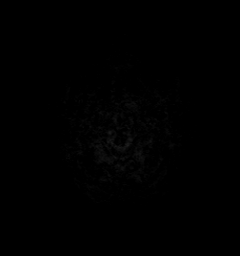

[Series 10: FLAIR · axial · 3.0mm · 0.43mm/px · z∈[-51,+96]mm · 4 of 38 slices shown]
[im 1/38]
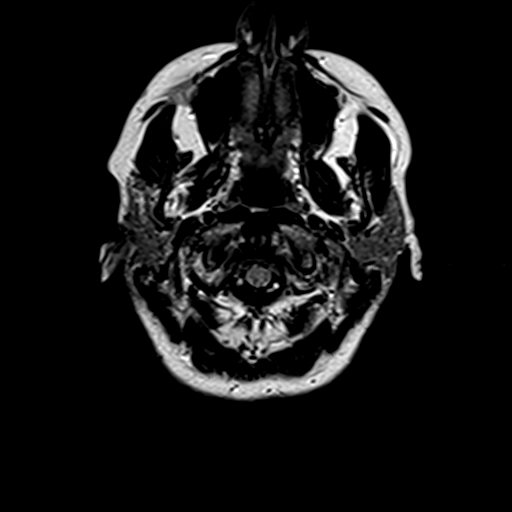
[im 13/38]
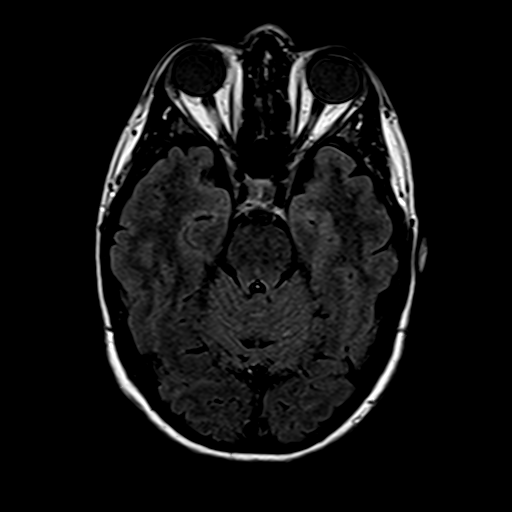
[im 25/38]
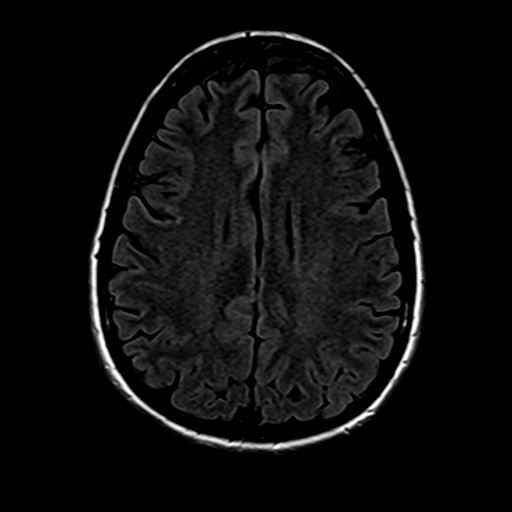
[im 38/38]
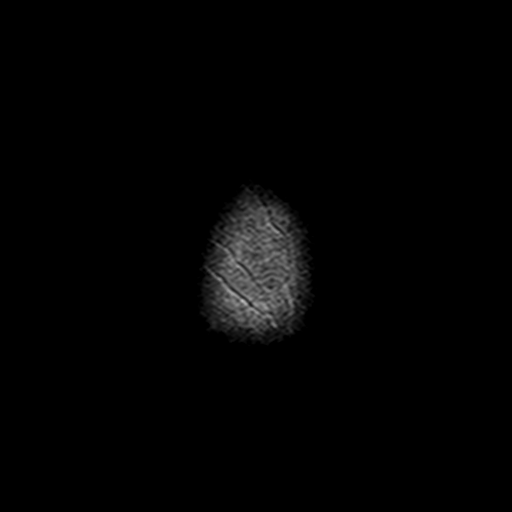

[Series 11: T1 · axial · 1.0mm · 0.72mm/px · z∈[-48,+95]mm · 13 of 144 slices shown (2 of 2)]
[im 1/144]
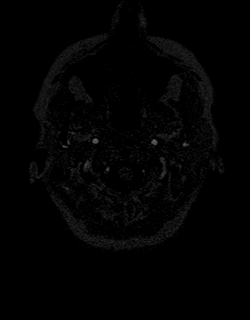
[im 12/144]
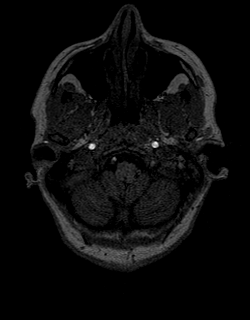
[im 24/144]
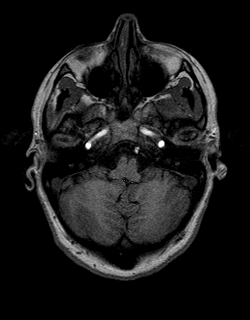
[im 36/144]
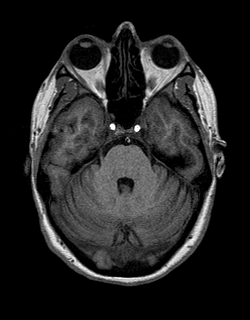
[im 48/144]
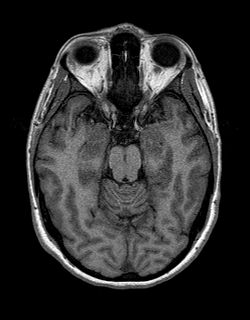
[im 60/144]
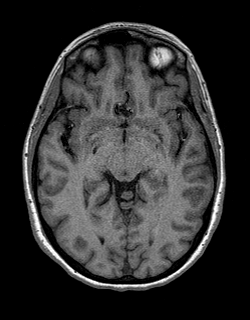
[im 72/144]
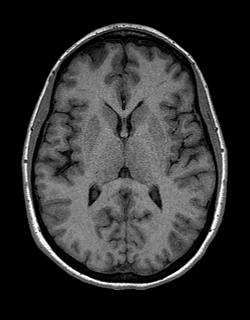
[im 84/144]
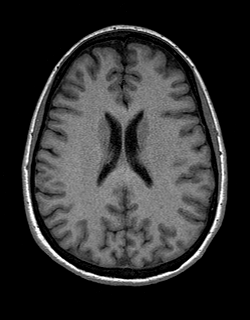
[im 96/144]
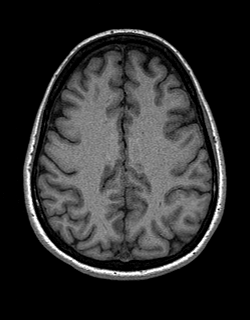
[im 108/144]
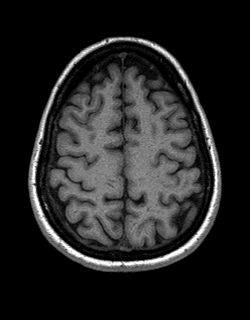
[im 120/144]
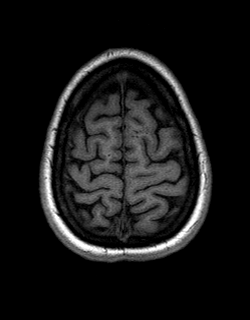
[im 132/144]
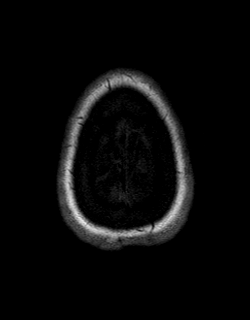
[im 144/144]
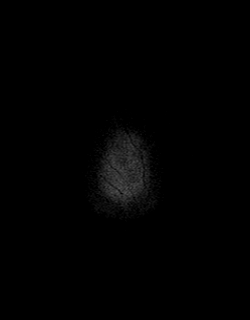

[48 of 48 positions shown; findings below may reference images not displayed]

FINDINGS: Brain: Normal cerebral volume. No restricted diffusion to suggest
acute infarction. No midline shift, mass effect, evidence of mass
lesion, ventriculomegaly, extra-axial collection or acute
intracranial hemorrhage. Cervicomedullary junction and pituitary are
within normal limits.

Gray and white matter signal is within normal limits throughout the
brain. No encephalomalacia or chronic cerebral blood products
identified.

Vascular: Major intracranial vascular flow voids are preserved.

Skull and upper cervical spine: Negative visible cervical spine.
Visualized bone marrow signal is within normal limits.

Sinuses/Orbits: Normal suprasellar cistern. Normal noncontrast
appearance of the cavernous sinus. Globes and intraorbital soft
tissues appears symmetric and normal. Only trace paranasal sinus
mucosal thickening. No sinus fluid level.

Other: Mastoids are clear. Visible internal auditory structures
appear normal. Negative visible scalp and face.
IMPRESSION: Normal noncontrast MRI appearance of the brain. No explanation for
symptoms.

## 2023-11-02 DIAGNOSIS — G43019 Migraine without aura, intractable, without status migrainosus: Secondary | ICD-10-CM | POA: Diagnosis not present

## 2024-04-03 ENCOUNTER — Encounter: Payer: Self-pay | Admitting: Neurology

## 2024-05-20 NOTE — Progress Notes (Unsigned)
 NEUROLOGY CONSULTATION NOTE  Traci Wyatt MRN: [id number] DOB: Feb 02, 1976  Referring provider: Alm Rav, MD Primary care provider: Alm Rav, MD  Reason for consult:  migraines  Assessment/Plan:   Migraine without aura, without status migrainosus, not intractable.  Due to having worsening migraines with new symptoms, check MRI of brain with and without contrast Migraine prevention:  zonisamide  150mg  daily Migraine rescue:  She will try samples of Nurtec. Limit use of pain relievers to no more than 9 days out of the month to prevent risk of rebound or medication-overuse headache. Keep headache diary Follow up 8 months.    Subjective:  Traci Wyatt is a 48 year old right-handed female with endometrosis, asthma, sleep disorder, SVT and history of recurrent syncope who presents for migraines.  History supplemented by prior neurologist's and referring provider's notes.  MRI brain personally reviewed.  Onset:  teenager Location:  left frontal/retroorbital/temporal/parietal.  Last month, it has been diffuse.  No longer responding to her medication.  No clear trigger but her chronic insomnia has been worse. Quality:  ice pick Intensity:  6-7/10.   Aura:  absent Prodrome:  absent.  Last month, legs started feeling achy prior to migraine.   Associated symptoms:  Photophobia, phonophobia, blurred vision, neck pain.  She denies associated nausea, vomiting, osmophobia, dizziness, autonomic symptoms, unilateral numbness or weakness. Duration:  usually 3-4 hours with [place] (repeat dose); since last month, all day Frequency:  3-4 a month; last month 6  Frequency of abortive medication: as needed Triggers:  change in weather, certain foods (dairy), certain scents (perfumes, cologne, smoke), poor sleep Relieving factors:  no Activity:  movement/routine activity aggravates  MRI of brain without contrast on 10/16/2021 was normal.    Past NSAIDS/analgesics:  ASA  (asthma attack), Celebrex (asthma attack), Percocet (vomiting), tramadol  Past abortive triptans:  sumatriptan 100mg , rizatriptan, naratriptan  Past abortive ergotamine:  none Past muscle relaxants:  cyclobenzaprine Past anti-emetic:  ondansetron Past antihypertensive medications:  none.  Baseline low blood pressure Past antidepressant medications:  amitriptyline  (side effect), doxepine, mirtazapine, trazodone Past anticonvulsant medications:  gabapentin , pregablin Past anti-CGRP:  none Past vitamins/Herbal/Supplements:  turmeric, melatonin (reaction) Past antihistamines/decongestants:  pseudoephedrine  Other past therapies:  trigger point injections and nerve blocks  Current NSAIDS/analgesics:  NSAIDs contraindicated (asthma attack) Current triptans:  none Current ergotamine:  none Current anti-emetic:  none Current muscle relaxants:  none Current Antihypertensive medications:  none Current Antidepressant medications:  none Current Anticonvulsant medications:  zonisamide  150mg  daily Current anti-CGRP:  Ubrelvy 100mg  Current Vitamins/Herbal/Supplements:  magnesium, MVI Current Antihistamines/Decongestants:  Flonase Other therapy:  none Birth control:  no Other medications:  hydroxyzine at bedtime (for insomnia)   Caffeine:  decaf coffee. No soda or energy drinks Alcohol:  no Smoker:  no Diet:  drinks at least 60 oz water daily.  Herbal tea.  Plant-based diet.  Avoids dairy Exercise:  lift weights, walks, yoga, tai chi  Depression:  no; Anxiety:  no Sleep hygiene:  Chronic insomnia   History of head trauma/concussion:  head injury as a child after hitting her head on the pavement.  Possibly concussion but never evaluated. Family history of headache:  No Family history of cerebral aneurysm:  No.  But mother and maternal uncle had carotid dissection      PAST MEDICAL HISTORY: Past Medical History:  Diagnosis Date   Asthma    Endometriosis     PAST SURGICAL  HISTORY: Past Surgical History:  Procedure Laterality Date   Endometriosis surgery  SHOULDER SURGERY     Left    MEDICATIONS: Current Outpatient Medications on File Prior to Visit  Medication Sig Dispense Refill   hydrOXYzine (ATARAX/VISTARIL) 25 MG tablet Take 25-50 mg by mouth at bedtime as needed.     norethindrone-ethinyl estradiol (LOESTRIN) 1-20 MG-MCG tablet Take 1 tablet by mouth daily.     Current Facility-Administered Medications on File Prior to Visit  Medication Dose Route Frequency Provider Last Rate Last Admin   methylPREDNISolone  acetate (DEPO-MEDROL ) injection 40 mg  40 mg Intra-Lesional Once Smith, Zachary M, DO        ALLERGIES: Allergies  Allergen Reactions   Aspirin    Celecoxib    Oxycodone-Acetaminophen     FAMILY HISTORY: Family History  Problem Relation Age of Onset   Stroke Mother    Chronic Renal Failure Mother    Heart attack Father 64    Objective:  Blood pressure 100/60, pulse 74, height 5' 5 (1.651 m), weight 120 lb (54.4 kg), last menstrual period 05/14/2024, SpO2 100%. General: No acute distress.  Patient appears well-groomed.   Head:  Normocephalic/atraumatic Eyes:  fundi examined but not visualized Neck: supple, no paraspinal tenderness, full range of motion Heart: regular rate and rhythm Neurological Exam: Mental status: alert and oriented to person, place, and time, speech fluent and not dysarthric, language intact. Cranial nerves: CN I: not tested CN II: pupils equal, round and reactive to light, visual fields intact CN III, IV, VI:  full range of motion, no nystagmus, no ptosis CN V: facial sensation intact. CN VII: upper and lower face symmetric CN VIII: hearing intact CN IX, X: gag intact, uvula midline CN XI: sternocleidomastoid and trapezius muscles intact CN XII: tongue midline Bulk & Tone: normal, no fasciculations. Motor:  muscle strength 5/5 throughout Sensation:  Pinprick and vibratory sensation intact. Deep  Tendon Reflexes:  2+ throughout,  toes downgoing.   Finger to nose testing:  Without dysmetria.   Gait:  Normal station and stride.  Romberg negative.    Thank you for allowing me to take part in the care of this patient.  Juliene Dunnings, DO  CC: Alm Rav, MD

## 2024-05-21 ENCOUNTER — Encounter: Payer: Self-pay | Admitting: Neurology

## 2024-05-21 ENCOUNTER — Ambulatory Visit: Admitting: Neurology

## 2024-05-21 VITALS — BP 100/60 | HR 74 | Ht 65.0 in | Wt 120.0 lb

## 2024-05-21 DIAGNOSIS — G43009 Migraine without aura, not intractable, without status migrainosus: Secondary | ICD-10-CM | POA: Diagnosis not present

## 2024-05-21 MED ORDER — ZONISAMIDE 50 MG PO CAPS: 150.0000 mg | ORAL_CAPSULE | Freq: Every day | ORAL | 1 refills | Status: DC

## 2024-05-21 NOTE — Patient Instructions (Signed)
 Check MRI of brain with and without contrast Continue zonisamide  150mg  daily Try taking Nurtec at earliest onset of migraine (once daily as needed).  Give me update

## 2024-05-31 ENCOUNTER — Ambulatory Visit
Admission: RE | Admit: 2024-05-31 | Discharge: 2024-05-31 | Disposition: A | Discharge status: Home / Self Care | Source: Ambulatory Visit | Attending: Neurology | Admitting: Neurology

## 2024-05-31 DIAGNOSIS — G43009 Migraine without aura, not intractable, without status migrainosus: Secondary | ICD-10-CM

## 2024-05-31 MED ORDER — GADOPICLENOL 0.5 MMOL/ML IV SOLN
6.0000 mL | Freq: Once | INTRAVENOUS | Status: AC | PRN
  Administered 2024-05-31: 6 mL via INTRAVENOUS

## 2024-06-03 ENCOUNTER — Ambulatory Visit: Payer: Self-pay | Admitting: Neurology

## 2024-06-03 NOTE — Progress Notes (Signed)
 Patient advised of her results.

## 2024-06-13 ENCOUNTER — Encounter: Payer: Self-pay | Admitting: Neurology

## 2024-07-30 ENCOUNTER — Encounter: Payer: Self-pay | Admitting: Neurology

## 2024-11-11 ENCOUNTER — Other Ambulatory Visit: Payer: Self-pay | Admitting: Neurology

## 2024-12-06 ENCOUNTER — Encounter: Payer: Self-pay | Admitting: Neurology

## 2024-12-09 ENCOUNTER — Other Ambulatory Visit: Payer: Self-pay | Admitting: Neurology

## 2024-12-09 MED ORDER — UBRELVY 100 MG PO TABS: 1.0000 | ORAL_TABLET | ORAL | 5 refills | Status: AC | PRN

## 2024-12-10 ENCOUNTER — Telehealth: Payer: Self-pay

## 2024-12-10 NOTE — Telephone Encounter (Signed)
 PA needed for [employer]

## 2024-12-11 ENCOUNTER — Other Ambulatory Visit (HOSPITAL_COMMUNITY): Payer: Self-pay

## 2024-12-11 ENCOUNTER — Telehealth: Payer: Self-pay | Admitting: Pharmacy Technician

## 2024-12-11 NOTE — Telephone Encounter (Signed)
 PA has been submitted, and telephone encounter has been created. Please see telephone encounter dated 2.4.26.

## 2024-12-11 NOTE — Telephone Encounter (Signed)
 Pharmacy Patient Advocate Encounter   Received notification from Pt Calls Messages that prior authorization for UBRELVY  100MG  is required/requested.   Insurance verification completed.   The patient is insured through [facility].   Per test claim: PA required; PA submitted to above mentioned insurance via Latent Key/confirmation #/EOC [id number] Status is pending

## 2025-01-20 ENCOUNTER — Ambulatory Visit: Admitting: Neurology
